# Patient Record
Sex: Female | Born: 1955 | Race: White | Hispanic: No | Smoking: Never smoker
Health system: Southern US, Community
[De-identification: ages and names within clinical notes are randomized; demographics above are authoritative.]

## PROBLEM LIST (undated history)

## (undated) DIAGNOSIS — G709 Myoneural disorder, unspecified: Secondary | ICD-10-CM

## (undated) DIAGNOSIS — M1990 Unspecified osteoarthritis, unspecified site: Secondary | ICD-10-CM

## (undated) DIAGNOSIS — M810 Age-related osteoporosis without current pathological fracture: Secondary | ICD-10-CM

## (undated) DIAGNOSIS — E7800 Pure hypercholesterolemia, unspecified: Secondary | ICD-10-CM

## (undated) DIAGNOSIS — R519 Headache, unspecified: Secondary | ICD-10-CM

## (undated) DIAGNOSIS — C4491 Basal cell carcinoma of skin, unspecified: Secondary | ICD-10-CM

## (undated) DIAGNOSIS — M542 Cervicalgia: Secondary | ICD-10-CM

## (undated) DIAGNOSIS — K219 Gastro-esophageal reflux disease without esophagitis: Secondary | ICD-10-CM

## (undated) DIAGNOSIS — M549 Dorsalgia, unspecified: Secondary | ICD-10-CM

## (undated) DIAGNOSIS — S060X9A Concussion with loss of consciousness of unspecified duration, initial encounter: Secondary | ICD-10-CM

## (undated) DIAGNOSIS — R51 Headache: Secondary | ICD-10-CM

## (undated) HISTORY — PX: HERNIA REPAIR: SHX51

## (undated) HISTORY — PX: WRIST SURGERY: SHX841

## (undated) HISTORY — DX: Myoneural disorder, unspecified: G70.9

## (undated) HISTORY — DX: Gastro-esophageal reflux disease without esophagitis: K21.9

## (undated) HISTORY — PX: ABDOMINAL HYSTERECTOMY: SHX81

## (undated) HISTORY — DX: Headache, unspecified: R51.9

## (undated) HISTORY — DX: Unspecified osteoarthritis, unspecified site: M19.90

## (undated) HISTORY — DX: Age-related osteoporosis without current pathological fracture: M81.0

## (undated) HISTORY — PX: KNEE SURGERY: SHX244

## (undated) HISTORY — DX: Dorsalgia, unspecified: M54.9

## (undated) HISTORY — DX: Pure hypercholesterolemia, unspecified: E78.00

## (undated) HISTORY — DX: Concussion with loss of consciousness of unspecified duration, initial encounter: S06.0X9A

## (undated) HISTORY — PX: EYE SURGERY: SHX253

## (undated) HISTORY — DX: Headache: R51

## (undated) HISTORY — DX: Cervicalgia: M54.2

## (undated) HISTORY — DX: Basal cell carcinoma of skin, unspecified: C44.91

---

## 1999-12-12 ENCOUNTER — Other Ambulatory Visit: Admission: RE | Admit: 1999-12-12 | Discharge: 1999-12-12 | Payer: Self-pay | Admitting: Obstetrics & Gynecology

## 2000-12-14 DIAGNOSIS — S060XAA Concussion with loss of consciousness status unknown, initial encounter: Secondary | ICD-10-CM

## 2000-12-14 DIAGNOSIS — S060X9A Concussion with loss of consciousness of unspecified duration, initial encounter: Secondary | ICD-10-CM | POA: Insufficient documentation

## 2000-12-14 HISTORY — DX: Concussion with loss of consciousness of unspecified duration, initial encounter: S06.0X9A

## 2000-12-14 HISTORY — DX: Concussion with loss of consciousness status unknown, initial encounter: S06.0XAA

## 2000-12-16 ENCOUNTER — Emergency Department (HOSPITAL_COMMUNITY): Admission: EM | Admit: 2000-12-16 | Discharge: 2000-12-16 | Payer: Self-pay | Admitting: Emergency Medicine

## 2001-01-10 ENCOUNTER — Encounter: Payer: Self-pay | Admitting: Orthopedic Surgery

## 2001-01-10 ENCOUNTER — Ambulatory Visit (HOSPITAL_COMMUNITY): Admission: RE | Admit: 2001-01-10 | Discharge: 2001-01-10 | Payer: Self-pay | Admitting: Orthopedic Surgery

## 2001-01-25 ENCOUNTER — Ambulatory Visit (HOSPITAL_COMMUNITY): Admission: RE | Admit: 2001-01-25 | Discharge: 2001-01-25 | Payer: Self-pay | Admitting: Orthopedic Surgery

## 2001-01-25 ENCOUNTER — Encounter: Payer: Self-pay | Admitting: Orthopedic Surgery

## 2001-02-18 ENCOUNTER — Inpatient Hospital Stay (HOSPITAL_COMMUNITY): Admission: EM | Admit: 2001-02-18 | Discharge: 2001-02-20 | Payer: Self-pay | Admitting: Emergency Medicine

## 2001-02-18 ENCOUNTER — Encounter: Payer: Self-pay | Admitting: Emergency Medicine

## 2001-02-18 ENCOUNTER — Encounter: Payer: Self-pay | Admitting: Internal Medicine

## 2001-02-19 ENCOUNTER — Encounter: Payer: Self-pay | Admitting: Neurology

## 2001-02-28 ENCOUNTER — Encounter: Admission: RE | Admit: 2001-02-28 | Discharge: 2001-05-29 | Payer: Self-pay | Admitting: Internal Medicine

## 2001-05-08 ENCOUNTER — Encounter: Admission: RE | Admit: 2001-05-08 | Discharge: 2001-08-06 | Payer: Self-pay | Admitting: Orthopedic Surgery

## 2001-06-03 ENCOUNTER — Other Ambulatory Visit: Admission: RE | Admit: 2001-06-03 | Discharge: 2001-06-03 | Payer: Self-pay | Admitting: Obstetrics & Gynecology

## 2001-07-09 ENCOUNTER — Encounter: Payer: Self-pay | Admitting: Neurology

## 2001-07-09 ENCOUNTER — Ambulatory Visit (HOSPITAL_COMMUNITY): Admission: RE | Admit: 2001-07-09 | Discharge: 2001-07-09 | Payer: Self-pay | Admitting: Neurology

## 2001-09-02 ENCOUNTER — Encounter: Admission: RE | Admit: 2001-09-02 | Discharge: 2001-12-01 | Payer: Self-pay | Admitting: Internal Medicine

## 2002-03-26 ENCOUNTER — Encounter: Admission: RE | Admit: 2002-03-26 | Discharge: 2002-03-26 | Payer: Self-pay | Admitting: Internal Medicine

## 2002-03-26 ENCOUNTER — Encounter: Payer: Self-pay | Admitting: Internal Medicine

## 2002-10-16 ENCOUNTER — Emergency Department (HOSPITAL_COMMUNITY): Admission: EM | Admit: 2002-10-16 | Discharge: 2002-10-16 | Payer: Self-pay | Admitting: *Deleted

## 2002-10-16 ENCOUNTER — Encounter: Payer: Self-pay | Admitting: Internal Medicine

## 2003-02-11 ENCOUNTER — Other Ambulatory Visit: Admission: RE | Admit: 2003-02-11 | Discharge: 2003-02-11 | Payer: Self-pay | Admitting: Obstetrics & Gynecology

## 2005-04-25 ENCOUNTER — Other Ambulatory Visit: Admission: RE | Admit: 2005-04-25 | Discharge: 2005-04-25 | Payer: Self-pay | Admitting: Obstetrics & Gynecology

## 2006-09-27 ENCOUNTER — Encounter: Admission: RE | Admit: 2006-09-27 | Discharge: 2006-09-27 | Payer: Self-pay | Admitting: Obstetrics & Gynecology

## 2006-12-19 ENCOUNTER — Ambulatory Visit: Payer: Self-pay | Admitting: Internal Medicine

## 2007-01-14 ENCOUNTER — Ambulatory Visit: Payer: Self-pay | Admitting: Internal Medicine

## 2007-01-21 ENCOUNTER — Encounter (INDEPENDENT_AMBULATORY_CARE_PROVIDER_SITE_OTHER): Payer: Self-pay | Admitting: Specialist

## 2007-01-21 ENCOUNTER — Ambulatory Visit: Payer: Self-pay | Admitting: Internal Medicine

## 2008-01-16 ENCOUNTER — Encounter: Admission: RE | Admit: 2008-01-16 | Discharge: 2008-01-16 | Payer: Self-pay | Admitting: Obstetrics & Gynecology

## 2009-02-11 ENCOUNTER — Encounter: Admission: RE | Admit: 2009-02-11 | Discharge: 2009-02-11 | Payer: Self-pay | Admitting: Internal Medicine

## 2010-04-28 ENCOUNTER — Emergency Department (HOSPITAL_COMMUNITY): Admission: EM | Admit: 2010-04-28 | Discharge: 2010-04-28 | Payer: Self-pay | Admitting: Family Medicine

## 2010-12-04 ENCOUNTER — Encounter: Payer: Self-pay | Admitting: Obstetrics & Gynecology

## 2011-02-06 ENCOUNTER — Other Ambulatory Visit: Payer: Self-pay | Admitting: Obstetrics & Gynecology

## 2011-02-06 DIAGNOSIS — R928 Other abnormal and inconclusive findings on diagnostic imaging of breast: Secondary | ICD-10-CM

## 2011-02-14 ENCOUNTER — Ambulatory Visit
Admission: RE | Admit: 2011-02-14 | Discharge: 2011-02-14 | Disposition: A | Discharge status: Home / Self Care | Payer: Medicare Other | Source: Ambulatory Visit | Attending: Obstetrics & Gynecology | Admitting: Obstetrics & Gynecology

## 2011-02-14 DIAGNOSIS — R928 Other abnormal and inconclusive findings on diagnostic imaging of breast: Secondary | ICD-10-CM

## 2011-03-31 NOTE — H&P (Signed)
[place]. [facility]  Patient:    Rebecca Montgomery, Rebecca Montgomery                      MRN: [id number] Adm. Date:  [id number] Attending:  Enrique Sack CC:         Erskine Speed, M.D.   History and Physical  CHIEF COMPLAINT: This patient is a 55 year old lady who has had problems with gait and unsteadiness of gait associated with clumsiness, truncal ataxia, and poor concentration since the last approximately two months.  HISTORY OF PRESENT ILLNESS: She had an epidural injection secondary to a herniated disk approximately two months ago and her symptoms seem to have been triggered since this time.  She also describes vague paresthesias, a feeling of floating of her body.  She denies any excessive stressors, although her job is a stressful one.  PAST MEDICAL/SURGICAL HISTORY: Hysterectomy.  No other serious illnesses or operations.  CURRENT MEDICATIONS: None regular.  ALLERGIES: None documented.  SOCIAL HISTORY: She is a single mother and her son lives with her.  She does not smoke and does not drink.  She is a Animator.  REVIEW OF SYSTEMS: Apart from the symptoms mentioned above there are no other symptoms referable to the cardiovascular, respiratory, gastrointestinal, musculoskeletal, endocrine, dermatologic, psychiatric, or genitourinary systems.  PHYSICAL EXAMINATION:  VITAL SIGNS: She is afebrile.  GENERAL: She is hemodynamically stable.  CARDIOVASCULAR: Heart sounds are present and normal, with no murmurs or other sounds.  CHEST: Lung fields are clear to auscultation and percussion.  ABDOMEN: Soft, nontender, no hepatosplenomegaly.  NEUROLOGIC: She is alert and oriented.  She has slight impairment of memory and mentation but there are no obvious focal neurological signs, especially cerebellar signs.  She is able to move her body but slowly.  She has normal tone and power in her upper and lower limbs.  Cranial nerves all seem to  be intact.  LABORATORY DATA: CT scan of the brain was normal in the emergency room.  IMPRESSION/PLAN: Unsteadiness of gait/truncal ataxia.  We will need to look at the cerebellum more closely and rule out demyelinating process.  I will get an MRI of the brain with and without contrast.  The other possibility here is of a psychosomatic symptomatology.  There does not seem to be any obvious triggering stressors.  Further recommendations will depend on the patients progress and she may need a neurological evaluation. DD:  02/18/01 TD:  02/18/01 Job: [id number]

## 2011-03-31 NOTE — Consult Note (Signed)
[place]. [facility]  Patient:    Rebecca Montgomery, Rebecca Montgomery                      MRN: [id number] Adm. Date:  [id number] Attending:  Enrique Sack                          Consultation Report  DATE OF BIRTH:  06/01/1956  REASON FOR CONSULTATION:  This 55 year old, right-handed, white, divorced female is seen at the request of Dr. Nila Nephew to evaluate a history of vertigo and weakness.  HISTORY OF PRESENT ILLNESS:  Rebecca Montgomery has been in good health most of her life.  She does have a history of migraine headaches; TM joint disease, requiring a splint; and depression following the death of her third child. She currently is a single mother with two children and has two jobs.  One of her children has birth defects.  She had been in her usual state of health until December 15, 2000, when she was working outside of her house and sorting wood and had a piece of wood in one hand and arm.  She fell with this log in her hand backwards, striking her head.  There was no definite loss of consciousness.  She was able to get up and go to her home and was taken to the [facility] Emergency Room.  She did not have any x-rays. Subsequently, she had problems with lower back pain, radiating down the front or anterior aspect of her legs and down the posterior aspect of her legs.  She has been seen by Dr. Lunette Stands for this symptomatology.  It was noted at the time that she had some neck pain as well according to Dr. Marcy Salvo notes from December 20, 2000.  Also, there was some history of her having had a back injury several years ago treated by Dr. Charlett Blake.  She had had a previous MRI study in 1996, demonstrating a bulging disk but no herniation.  Her examination at the time revealed a couple of beats of clonus at both ankles. There was some restriction of motion in her neck.  She underwent a MRI study of the lumbar spine without contrast on December 25, 2000, which was  compared with lumbar radiographs done December 20, 2000, in Dr. Marcy Salvo office.  There was evidence of scoliosis convexed to the right at L1 and to the left at L5. There were some suspicious marrow signal abnormalities; the L4-L5 disk is desiccated.  The other disks were well hydrated.  The conus medullaris extended to the upper L1 level and appeared to be normal.  The L4-L5 level showed a mild annular disk bulging present with small, left-sided disk protrusion best seen on parasagittal images but without resulting foraminal stenosis on the L4 nerve root.  There was no significant central stenosis. There was some mild facet hypertrophy bilaterally.  There was mild facet hypertrophy at the L5-S1 level, but the disk remained well hydrated.  The impression was mild biconvexed scoliosis with degenerative disk disease at L4-5 and shallow posterior lateral disk protrusion to the left, not causing any spinal stenosis or nerve root encroachment.  There was mild facet hypertrophy at L4-5 and L5-S1.  She was tried on a course of p.o. prednisone. She was also tried on epidural steroid treatment with some improvement in symptomatology.  She was also given a course of Vioxx and recommended for  light duty, not lifting over 10 pounds.  In addition to lower back pain, which has improved, she has been having difficulty with dizziness at times associated with a spinning sensation, particularly after having one procaine injection after her epidural steroids.  At times, however, she describes a light-headed sensation.  This dizziness occurs in the sitting, lying, and standing positions and can be associate with nausea.  It is aggravated by movement of her head.  It has rarely been associated with vomiting but has been associated with some vomiting.  It never goes away.  She describes a floating sensation with this.  She has also described difficulty with concentration and possibly some difficulty with her  remembrance and also slowness in movement.  She states that she had taken jelly and putting it in her coffee.  She has been dropping things with her hands and arms.  She had done fairly well following her epidural steroid injections.  She was at church on Sunday and subsequently had to drive to a baby shower and was to drive back to church.  She drove 1 hour 45 minutes to the baby shower, did not feel well when she went to room, noticing some fluorescent lights.  She then felt dizzy and had some nausea.  She had to sit down.  She then felt okay to drive back but had difficulty remembering when she was driving back and concentrating when driving back and noticed that it took a lot of effort to perform these tasks.  When she got to her church, she was able to go in but had to be carried out and was brought to the emergency room and admitted by Dr. Amanda Cockayne associate.  LABORATORY DATA:  She has had studies in the hospital, which I reviewed, including an MRI study of the brain and intracranial MRA, both of which were normal.  She has had a CT scan of the brain, which was unremarkable.  Other laboratory studies included a glucose of 113, BUN 5, sodium 139, potassium 3.4, chloride 109, CO2 content 25.  Liver function tests were normal.  White blood cell count was 5300, hemoglobin 12.1, hematocrit 34.4, 59% polys, 28% lymphs, and 12% monocytes, which was high.  Erythrocyte sedimentation rate was 30 with a vitamin B12 level of 249 and RBC of 334.  Her TSH was in the high normal range at 4.952.  MRI study of the brain and MRA that I have mentioned as well as CT scan.  I did not see a report of any cervical spine x-rays.  PHYSICAL EXAMINATION:  GENERAL:  Examination revealed a well-developed, depressed-appearing white female with slowness of movement and generalized bradykinesia.  VITAL SIGNS:  Her sitting blood pressure in the right arm was 120/80 and left arm 110/80.  HEENT:  Both tympanic  membranes were seen.  Neck flexion-extension maneuvers revealed aggravation of dizziness with turning her head.  When first seen, she was in the sitting position and subsequently needed to lie down because she  felt badly.  When she opened her jaw, she opened it to the right.  MENTAL STATUS:  She was alert and oriented x 3.  She knew the president but had difficulty with vice president.  She scored 29 out of 30, not remembering one of the three objects at 5 minutes.  She had no evidence of an aphasia and had good pronunciation of words.  There was no evidence of an agnosia or apraxia.  She could give historical features, such as the  history as above, though at times, it was somewhat disjointed.  She could give the ages and names of her children and their birth dates, and she could perform calculations such as nickels and a dollar, $1.25, $1.35, spell "world" forwards and backwards, could subtract $5.95 from a $20 bill.  NEUROLOGIC:  Her cranial nerve examination revealed her visual fields to be full, pupils to be reactive from 4 to 3 bilaterally.  There was a right ptosis.  Both disks were seen and flat.  The extraocular movements were full. There was no nystagmus.  There was no ocular dysmetria, and corneals were present.  The facial sensation was equal.  Hearing was intact.  Tongue was midline.  The uvula was midline.  Gags were present.  Jaw opened to the right. Sternocleidomastoid and trapezius testing was normal.  Motor examination revealed excellent strength in the upper and lower extremities.  She had an intention tremor with the right hand and arm, which was distractible.  There was no cogwheeling present.  Her sensory examination was intact to pinprick, touch, and joint position.  The deep tendon reflexes were 2 to 3+.  Plantar responses were downgoing.  Her gait examination revealed a slow gait with anterior flexion of her lumbar spine.  As she walked, it was wide based  with probably a 5-inch base and short steps.  She could stand on her toes.  She could stand on her heels.  IMPRESSION: 1. Vertigo, code 780.4. 2. Gait disorder, code 781.2. 3. Lower back pain, code 724.4. 4. Head trauma, code 854.01. 5. Memory loss, code 294.9. 6. Depression, code 311. 7. Postconcussion syndrome, code 310.2.  PLAN:  To obtain cervical spine x-rays and begin her on a course of amitriptyline. DD:  02/19/01 TD:  02/19/01 Job: [id number]
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
## 2013-02-17 ENCOUNTER — Telehealth: Payer: Self-pay | Admitting: Neurology

## 2013-02-17 NOTE — Telephone Encounter (Signed)
Pt called and was asking for last ofv note copy to be faxed to her [phone].  Release faxed to [place] and documents sent.

## 2013-09-16 DIAGNOSIS — J383 Other diseases of vocal cords: Secondary | ICD-10-CM

## 2013-09-16 HISTORY — DX: Other diseases of vocal cords: J38.3

## 2014-12-11 DIAGNOSIS — B009 Herpesviral infection, unspecified: Secondary | ICD-10-CM

## 2014-12-11 HISTORY — DX: Herpesviral infection, unspecified: B00.9

## 2015-01-20 DIAGNOSIS — Z87892 Personal history of anaphylaxis: Secondary | ICD-10-CM

## 2015-01-20 HISTORY — DX: Personal history of anaphylaxis: Z87.892

## 2016-07-03 DIAGNOSIS — C44319 Basal cell carcinoma of skin of other parts of face: Secondary | ICD-10-CM | POA: Insufficient documentation

## 2016-08-02 DIAGNOSIS — H9190 Unspecified hearing loss, unspecified ear: Secondary | ICD-10-CM | POA: Insufficient documentation

## 2016-08-09 DIAGNOSIS — Z90710 Acquired absence of both cervix and uterus: Secondary | ICD-10-CM | POA: Insufficient documentation

## 2016-08-09 HISTORY — DX: Acquired absence of both cervix and uterus: Z90.710

## 2018-04-03 DIAGNOSIS — K219 Gastro-esophageal reflux disease without esophagitis: Secondary | ICD-10-CM

## 2018-04-03 HISTORY — DX: Gastro-esophageal reflux disease without esophagitis: K21.9

## 2018-07-04 DIAGNOSIS — E7849 Other hyperlipidemia: Secondary | ICD-10-CM | POA: Insufficient documentation

## 2018-07-04 DIAGNOSIS — E785 Hyperlipidemia, unspecified: Secondary | ICD-10-CM

## 2018-07-04 HISTORY — DX: Hyperlipidemia, unspecified: E78.5

## 2018-07-04 HISTORY — DX: Other hyperlipidemia: E78.49

## 2018-07-06 DIAGNOSIS — Z1211 Encounter for screening for malignant neoplasm of colon: Secondary | ICD-10-CM | POA: Insufficient documentation

## 2018-07-06 HISTORY — DX: Encounter for screening for malignant neoplasm of colon: Z12.11

## 2018-07-16 ENCOUNTER — Emergency Department (HOSPITAL_COMMUNITY)
Admission: EM | Admit: 2018-07-16 | Discharge: 2018-07-17 | Disposition: A | Discharge status: Home / Self Care | Payer: Medicare Other | Attending: Emergency Medicine | Admitting: Emergency Medicine

## 2018-07-16 ENCOUNTER — Encounter (HOSPITAL_COMMUNITY): Payer: Self-pay | Admitting: Emergency Medicine

## 2018-07-16 ENCOUNTER — Other Ambulatory Visit: Payer: Self-pay

## 2018-07-16 ENCOUNTER — Emergency Department (HOSPITAL_COMMUNITY): Payer: Medicare Other

## 2018-07-16 DIAGNOSIS — M542 Cervicalgia: Secondary | ICD-10-CM | POA: Diagnosis not present

## 2018-07-16 DIAGNOSIS — R51 Headache: Secondary | ICD-10-CM | POA: Diagnosis not present

## 2018-07-16 DIAGNOSIS — R42 Dizziness and giddiness: Secondary | ICD-10-CM | POA: Insufficient documentation
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
## 2018-07-16 NOTE — ED Notes (Signed)
Pt ambulated from waiting room to Pod C without Assistance
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
## 2018-07-17 ENCOUNTER — Emergency Department (HOSPITAL_COMMUNITY): Payer: Medicare Other

## 2018-07-17 LAB — I-STAT CHEM 8, ED
BUN: 9 mg/dL (ref 8–23)
CALCIUM ION: 1.19 mmol/L (ref 1.15–1.40)
Chloride: 104 mmol/L (ref 98–111)
Creatinine, Ser: 0.8 mg/dL (ref 0.44–1.00)
GLUCOSE: 98 mg/dL (ref 70–99)
HCT: 37 % (ref 36.0–46.0)
Hemoglobin: 12.6 g/dL (ref 12.0–15.0)
Potassium: 3.9 mmol/L (ref 3.5–5.1)
Sodium: 139 mmol/L (ref 135–145)
TCO2: 26 mmol/L (ref 22–32)

## 2018-07-17 MED ORDER — DIAZEPAM 5 MG PO TABS
5.0000 mg | ORAL_TABLET | Freq: Once | ORAL | Status: DC
  Filled 2018-07-17: qty 1

## 2018-07-17 MED ORDER — CYCLOBENZAPRINE HCL 5 MG PO TABS: 5.0000 mg | ORAL_TABLET | Freq: Two times a day (BID) | ORAL | 0 refills | Status: DC | PRN

## 2018-07-17 MED ORDER — IOPAMIDOL (ISOVUE-370) INJECTION 76%
INTRAVENOUS | Status: AC
  Filled 2018-07-17: qty 50

## 2018-07-17 MED ORDER — IOPAMIDOL (ISOVUE-370) INJECTION 76%
50.0000 mL | Freq: Once | INTRAVENOUS | Status: AC | PRN
  Administered 2018-07-17: 50 mL via INTRAVENOUS

## 2018-07-17 MED ORDER — NAPROXEN 500 MG PO TABS: 500.0000 mg | ORAL_TABLET | Freq: Two times a day (BID) | ORAL | 0 refills | Status: DC
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
## 2018-07-17 NOTE — ED Notes (Signed)
Patient transported to CT 

## 2018-07-24 ENCOUNTER — Other Ambulatory Visit: Payer: Self-pay | Admitting: Orthopedic Surgery

## 2018-07-24 DIAGNOSIS — R2689 Other abnormalities of gait and mobility: Secondary | ICD-10-CM

## 2018-07-24 DIAGNOSIS — R200 Anesthesia of skin: Secondary | ICD-10-CM

## 2018-07-24 DIAGNOSIS — R531 Weakness: Secondary | ICD-10-CM

## 2018-07-28 ENCOUNTER — Ambulatory Visit
Admission: RE | Admit: 2018-07-28 | Discharge: 2018-07-28 | Disposition: A | Discharge status: Home / Self Care | Payer: BC Managed Care – PPO | Source: Ambulatory Visit | Attending: Orthopedic Surgery | Admitting: Orthopedic Surgery

## 2018-07-28 DIAGNOSIS — R200 Anesthesia of skin: Secondary | ICD-10-CM

## 2018-07-28 DIAGNOSIS — R531 Weakness: Secondary | ICD-10-CM

## 2018-07-28 DIAGNOSIS — R2689 Other abnormalities of gait and mobility: Secondary | ICD-10-CM

## 2018-08-02 ENCOUNTER — Encounter: Payer: Self-pay | Admitting: *Deleted

## 2018-08-05 ENCOUNTER — Encounter: Payer: Self-pay | Admitting: Neurology

## 2018-08-05 ENCOUNTER — Ambulatory Visit: Payer: Medicare Other | Admitting: Neurology

## 2018-08-05 VITALS — BP 137/85 | HR 76 | Ht 68.0 in | Wt 150.0 lb

## 2018-08-05 DIAGNOSIS — R413 Other amnesia: Secondary | ICD-10-CM | POA: Insufficient documentation

## 2018-08-05 DIAGNOSIS — R519 Headache, unspecified: Secondary | ICD-10-CM | POA: Insufficient documentation

## 2018-08-05 DIAGNOSIS — R51 Headache: Secondary | ICD-10-CM

## 2018-08-05 HISTORY — DX: Headache, unspecified: R51.9
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
## 2018-08-06 ENCOUNTER — Telehealth: Payer: Self-pay | Admitting: *Deleted

## 2018-08-06 ENCOUNTER — Ambulatory Visit: Payer: Medicare Other | Admitting: Neurology

## 2018-08-06 ENCOUNTER — Telehealth: Payer: Self-pay | Admitting: Neurology

## 2018-08-06 DIAGNOSIS — R519 Headache, unspecified: Secondary | ICD-10-CM

## 2018-08-06 DIAGNOSIS — R410 Disorientation, unspecified: Secondary | ICD-10-CM | POA: Diagnosis not present

## 2018-08-06 DIAGNOSIS — R51 Headache: Principal | ICD-10-CM

## 2018-08-06 DIAGNOSIS — R413 Other amnesia: Secondary | ICD-10-CM

## 2018-08-06 LAB — FOLATE

## 2018-08-06 LAB — SEDIMENTATION RATE: SED RATE: 11 mm/h (ref 0–40)

## 2018-08-06 LAB — VITAMIN B12: Vitamin B-12: 393 pg/mL (ref 232–1245)

## 2018-08-06 LAB — RPR: RPR Ser Ql: NONREACTIVE

## 2018-08-06 LAB — C-REACTIVE PROTEIN: CRP: 2 mg/L (ref 0–10)

## 2018-08-06 LAB — TSH: TSH: 2.46 u[IU]/mL (ref 0.450–4.500)

## 2018-08-06 NOTE — Telephone Encounter (Signed)
-----   Message from Marcial Pacas, MD sent at 08/06/2018 12:05 PM EDT ----- Please call patient for normal laboratory result

## 2018-08-06 NOTE — Telephone Encounter (Signed)
Spoke to patient - she is aware of her lab results. 

## 2018-08-06 NOTE — Telephone Encounter (Signed)
Patient seen yesterday. Forgot to ask Dr. Krista Blue if any type of therapy would help. Here today for EEG. Best call back is [phone].

## 2018-08-06 NOTE — Telephone Encounter (Signed)
Per vo by Dr. Krista Blue, she offered to refer her to PT for gait training.  I spoke to the patient and she has had this therapy in the past.  States she remembers the home exercises and would like to try it on her own first.  She is aware that we are happy to place the referral, if she changes her mind.

## 2018-08-09 NOTE — Procedures (Signed)
   HISTORY: 62 years old female, with history of headaches, also complains of memory loss  TECHNIQUE:  16 channel EEG was performed based on standard 10-16 international system. One channel was dedicated to EKG, which has demonstrates normal sinus rhythm of beats per minutes.  Upon awakening, the posterior background activity was well-developed, in alpha range, 10 Hz, reactive to eye opening and closure.  There was no evidence of epileptiform discharge.  Photic stimulation was performed, which induced a symmetric photic driving.  Hyperventilation was performed, there was no abnormality elicit.  No sleep was achieved.  CONCLUSION: This is a  normal awake EEG.  There is no electrodiagnostic evidence of epileptiform discharge.  Marcial Pacas, M.D. Ph.D.  [facility] [place], [place] [address] Phone: [phone] Fax:      [phone]

## 2018-08-27 ENCOUNTER — Ambulatory Visit: Payer: Medicare Other | Admitting: Neurology

## 2018-08-27 ENCOUNTER — Encounter: Payer: Self-pay | Admitting: Neurology

## 2018-08-27 VITALS — BP 142/83 | HR 79 | Ht 68.0 in | Wt 152.0 lb

## 2018-08-27 DIAGNOSIS — R413 Other amnesia: Secondary | ICD-10-CM | POA: Diagnosis not present

## 2018-08-27 DIAGNOSIS — R51 Headache: Secondary | ICD-10-CM | POA: Diagnosis not present

## 2018-08-27 DIAGNOSIS — R519 Headache, unspecified: Secondary | ICD-10-CM
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
## 2018-10-03 DIAGNOSIS — Z85828 Personal history of other malignant neoplasm of skin: Secondary | ICD-10-CM

## 2018-10-03 HISTORY — DX: Personal history of other malignant neoplasm of skin: Z85.828
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
## 2018-11-20 ENCOUNTER — Ambulatory Visit: Payer: BC Managed Care – PPO | Admitting: Neurology

## 2018-11-26 ENCOUNTER — Ambulatory Visit: Payer: BC Managed Care – PPO | Admitting: Cardiology

## 2018-12-27 ENCOUNTER — Encounter: Payer: Self-pay | Admitting: Cardiology

## 2018-12-27 ENCOUNTER — Ambulatory Visit: Payer: Medicare Other | Admitting: Cardiology

## 2018-12-27 DIAGNOSIS — R002 Palpitations: Secondary | ICD-10-CM

## 2018-12-27 DIAGNOSIS — R0789 Other chest pain: Secondary | ICD-10-CM

## 2018-12-27 DIAGNOSIS — E785 Hyperlipidemia, unspecified: Secondary | ICD-10-CM

## 2018-12-27 HISTORY — DX: Other chest pain: R07.89

## 2018-12-27 HISTORY — DX: Hyperlipidemia, unspecified: E78.5

## 2018-12-27 HISTORY — DX: Palpitations: R00.2

## 2018-12-27 NOTE — Patient Instructions (Signed)
Medication Instructions:  Your physician recommends that you continue on your current medications as directed. Please refer to the Current Medication list given to you today.  If you need a refill on your cardiac medications before your next appointment, please call your pharmacy.   Lab work: None. If you have labs (blood work) drawn today and your tests are completely normal, you will receive your results only by: Marland Kitchen MyChart Message (if you have MyChart) OR . A paper copy in the mail If you have any lab test that is abnormal or we need to change your treatment, we will call you to review the results.  Testing/Procedures: Your physician has requested that you have an echocardiogram. Echocardiography is a painless test that uses sound waves to create images of your heart. It provides your doctor with information about the size and shape of your heart and how well your heart's chambers and valves are working. This procedure takes approximately one hour. There are no restrictions for this procedure.  Your physician has requested that you have a stress echocardiogram. For further information please visit [email or web address]. Please follow instruction sheet as given.  Your physician has recommended that you wear an event monitor. Event monitors are medical devices that record the heart's electrical activity. Doctors most often [place] these monitors to diagnose arrhythmias. Arrhythmias are problems with the speed or rhythm of the heartbeat. The monitor is a small, portable device. You can wear one while you do your normal daily activities. This is usually used to diagnose what is causing palpitations/syncope (passing out). Wear for 30 days     Follow-Up: At [facility], you and your health needs are our priority.  As part of our continuing mission to provide you with exceptional heart care, we have created designated Provider Care Teams.  These Care Teams include your primary Cardiologist (physician)  and Advanced Practice Providers (APPs -  Physician Assistants and Nurse Practitioners) who all work together to provide you with the care you need, when you need it. You will need a follow up appointment in 6 weeks.  Please call our office 2 months in advance to schedule this appointment.  You may see No primary care provider on file. or another member of our [employer] Provider Team in [place]: Shirlee More, MD . Jyl Heinz, MD  Any Other Special Instructions Will Be Listed Below (If Applicable).   Echocardiogram An echocardiogram is a procedure that uses painless sound waves (ultrasound) to produce an image of the heart. Images from an echocardiogram can provide important information about:  Signs of coronary artery disease (CAD).  Aneurysm detection. An aneurysm is a weak or damaged part of an artery wall that bulges out from the normal force of blood pumping through the body.  Heart size and shape. Changes in the size or shape of the heart can be associated with certain conditions, including heart failure, aneurysm, and CAD.  Heart muscle function.  Heart valve function.  Signs of a past heart attack.  Fluid buildup around the heart.  Thickening of the heart muscle.  A tumor or infectious growth around the heart valves. Tell a health care provider about:  Any allergies you have.  All medicines you are taking, including vitamins, herbs, eye drops, creams, and over-the-counter medicines.  Any blood disorders you have.  Any surgeries you have had.  Any medical conditions you have.  Whether you are pregnant or may be pregnant. What are the risks? Generally, this is a safe procedure. However,  problems may occur, including:  Allergic reaction to dye (contrast) that may be used during the procedure. What happens before the procedure? No specific preparation is needed. You may eat and drink normally. What happens during the procedure?   An IV tube may be  inserted into one of your veins.  You may receive contrast through this tube. A contrast is an injection that improves the quality of the pictures from your heart.  A gel will be applied to your chest.  A wand-like tool (transducer) will be moved over your chest. The gel will help to transmit the sound waves from the transducer.  The sound waves will harmlessly bounce off of your heart to allow the heart images to be captured in real-time motion. The images will be recorded on a computer. The procedure may vary among health care providers and hospitals. What happens after the procedure?  You may return to your normal, everyday life, including diet, activities, and medicines, unless your health care provider tells you not to do that. Summary  An echocardiogram is a procedure that uses painless sound waves (ultrasound) to produce an image of the heart.  Images from an echocardiogram can provide important information about the size and shape of your heart, heart muscle function, heart valve function, and fluid buildup around your heart.  You do not need to do anything to prepare before this procedure. You may eat and drink normally.  After the echocardiogram is completed, you may return to your normal, everyday life, unless your health care provider tells you not to do that. This information is not intended to replace advice given to you by your health care provider. Make sure you discuss any questions you have with your health care provider. Document Released: 10/27/2000 Document Revised: 12/02/2016 Document Reviewed: 12/02/2016 Elsevier Interactive Patient Education  2019 [employer].   Exercise Stress Echocardiogram  An exercise stress echocardiogram is a test to check how well your heart is working. This test uses sound waves (ultrasound) and a computer to make images of your heart before and after exercise. Ultrasound images that are taken before you exercise (your resting  echocardiogram) will show how much blood is getting to your heart muscle and how well your heart muscle and heart valves are functioning. During the next part of this test, you will walk on a treadmill or ride a stationary bike to see how exercise affects your heart. While you exercise, the electrical activity of your heart will be monitored with an electrocardiogram (ECG). Your blood pressure will also be monitored. You may have this test if you:  Have chest pain or other symptoms of a heart problem.  Recently had a heart attack or heart surgery.  Have heart valve problems.  Have a condition that causes narrowing of the blood vessels that supply your heart (coronary artery disease).  Have a high risk of heart disease and are starting a new exercise program.  Have a high risk of heart disease and need to have major surgery. Tell a health care provider about:  Any allergies you have.  All medicines you are taking, including vitamins, herbs, eye drops, creams, and over-the-counter medicines.  Any problems you or family members have had with anesthetic medicines.  Any blood disorders you have.  Any surgeries you have had.  Any medical conditions you have.  Whether you are pregnant or may be pregnant. What are the risks? Generally, this is a safe procedure. However, problems may occur, including:  Chest pain.  Dizziness or light-headedness.  Shortness of breath.  Increased or irregular heartbeat (palpitations).  Nausea or vomiting.  Heart attack (very rare). What happens before the procedure?  Follow instructions from your health care provider about eating or drinking restrictions. You may be asked to avoid all forms of caffeine for 24 hours before your procedure, or as told by your health care provider.  Ask your health care provider about changing or stopping your regular medicines. This is especially important if you are taking diabetes medicines or blood  thinners.  If you use an inhaler, bring it with you to the test.  Wear loose, comfortable clothing and walking shoes.  Do notuse any products that contain nicotine or tobacco, such as cigarettes and e-cigarettes, for 4 hours before the test or as told by your health care provider. If you need help quitting, ask your health care provider. What happens during the procedure?  You will take off your clothes from the waist up and put on a hospital gown.  A technician will place electrodes on your chest.  A blood pressure cuff will be placed on your arm.  You will lie down on a table for an ultrasound exam before you exercise. Gel will be rubbed on your chest, and a handheld device (transducer) will be pressed against your chest and moved over your heart.  Then, you will start exercising by walking on a treadmill or pedaling a stationary bicycle.  Your blood pressure and heart rhythm will be monitored while you exercise.  The exercise will gradually get harder or faster.  You will exercise until: ? Your heart reaches a target level. ? You are too tired to continue. ? You cannot continue because of chest pain, weakness, or dizziness.  You will have another ultrasound exam after you stop exercising. The procedure may vary among health care providers and hospitals. What happens after the procedure?  Your heart rate and blood pressure will be monitored until they return to your normal levels. Summary  An exercise stress echocardiogram is a test that uses ultrasound to check how well your heart works before and after exercise.  Before the test, follow instructions from your health care provider about stopping medications, avoiding nicotine and tobacco, and avoiding certain foods and drinks.  During the test, your blood pressure and heart rhythm will be monitored while you exercise on a treadmill or stationary bicycle. This information is not intended to replace advice given to you by  your health care provider. Make sure you discuss any questions you have with your health care provider. Document Released: 11/03/2004 Document Revised: 06/21/2016 Document Reviewed: 06/21/2016 Elsevier Interactive Patient Education  2019 [employer].

## 2018-12-27 NOTE — Progress Notes (Signed)
Cardiology Consultation:    Date:  12/27/2018   ID:  Rebecca Montgomery, DOB 1956-10-03, MRN [id number]  PCP:  Patient, No Pcp Per  Cardiologist:  Jenne Campus, MD   Referring MD: No ref. provider found   Chief Complaint  Patient presents with  . Palpitations  . Hypertension  . Family history of heart problems  . Chest Pain  Having a lot of issues  History of Present Illness:    Rebecca Montgomery is a 63 y.o. female who is being seen today for the evaluation of pain and palpitations at the request of No ref. provider found.  She recently relocated from [place].  When she came here she started having some problem with allergies she is being followed by allergy specialist.  She comes to me because she had some worrisome symptoms she described to have some palpitations.  She feel her heart skipping also feels sensation very forceful beating of her heart.  Described also sensation when her heart will abruptly start going very fast and then slows down abruptly.  It usually happen when she does have some primary care allergist.  Also described to have situation of chest pain.  She will get chest pain at rest it will be heavy-like sensation lasting all day at the same time she goes to gym on a regular basis she is able to exercise with no difficulties.  She can go on the treadmill 4 mph 4% incline and she can last about 30 minutes on this level of exercise with some fatigue shortness of breath.  She is very concerned about her health and she would like to find out what is the problem.  Denies having swelling of lower extremities no dizziness no passing out  Past Medical History:  Diagnosis Date  . Acid reflux   . Back pain   . Basal cell adenocarcinoma   . Concussion 12/2000  . Headache   . Hypercholesteremia   . Neck pain   . Osteoporosis     Past Surgical History:  Procedure Laterality Date  . ABDOMINAL HYSTERECTOMY    . EYE SURGERY Right   . HERNIA REPAIR    . KNEE SURGERY Right     . WRIST SURGERY Right     Current Medications: Current Meds  Medication Sig  . acyclovir (ZOVIRAX) 400 MG tablet Take 400 mg by mouth 2 (two) times daily.   Marland Kitchen aspirin 81 MG tablet Take 81 mg by mouth daily.  Marland Kitchen EPINEPHrine 0.3 mg/0.3 mL IJ SOAJ injection INJECT CONTENTS OF 1 PEN INTO OUTER THIGH FOR SEVERE ALLERGIC REACTION. CALL 911 AFTER USE.  Marland Kitchen omeprazole (PRILOSEC) 20 MG capsule Take 20 mg by mouth daily.      Allergies:   Bee venom; Lidocaine; and Shellfish allergy   Social History   Socioeconomic History  . Marital status: Divorced    Spouse name: Not on file  . Number of children: 2  . Years of education: masters  . Highest education level: Not on file  Occupational History  . Occupation: Retired  Scientific laboratory technician  . Financial resource strain: Not on file  . Food insecurity:    Worry: Not on file    Inability: Not on file  . Transportation needs:    Medical: Not on file    Non-medical: Not on file  Tobacco Use  . Smoking status: Never Smoker  . Smokeless tobacco: Never Used  Substance and Sexual Activity  . Alcohol use: Never  Frequency: Never  . Drug use: Never  . Sexual activity: Not on file  Lifestyle  . Physical activity:    Days per week: Not on file    Minutes per session: Not on file  . Stress: Not on file  Relationships  . Social connections:    Talks on phone: Not on file    Gets together: Not on file    Attends religious service: Not on file    Active member of club or organization: Not on file    Attends meetings of clubs or organizations: Not on file    Relationship status: Not on file  Other Topics Concern  . Not on file  Social History Narrative   Lives at home alone.   Right-handed.   Glass of tea in the morning.      Family History: The patient's family history includes Congestive Heart Failure in her father; Dementia in her mother; Diabetes in her sister and sister; Heart defect in her daughter; Heart failure in her mother;  Hyperlipidemia in her brother, brother, sister, and sister; Stroke in her brother and brother. ROS:   Please see the history of present illness.    All 14 point review of systems negative except as described per history of present illness.  EKGs/Labs/Other Studies Reviewed:    The following studies were reviewed today:   EKG:  EKG is  ordered today.  The ekg ordered today demonstrates normal sinus rhythm possible left atrial enlargement poor R wave progression in anterior precordium raising suspicion for anterior septal wall myocardial infarction nonspecific ST segment changes  Recent Labs: 07/17/2018: BUN 9; Creatinine, Ser 0.80; Hemoglobin 12.6; Potassium 3.9; Sodium 139 08/05/2018: TSH 2.460  Recent Lipid Panel No results found for: CHOL, TRIG, HDL, CHOLHDL, VLDL, LDLCALC, LDLDIRECT  Physical Exam:    VS:  BP 130/82   Pulse 85   Ht 5\' 8"  (1.727 m)   Wt 153 lb 6.4 oz (69.6 kg)   SpO2 98%   BMI 23.32 kg/m     Wt Readings from Last 3 Encounters:  12/27/18 153 lb 6.4 oz (69.6 kg)  08/27/18 152 lb (68.9 kg)  08/05/18 150 lb (68 kg)     GEN:  Well nourished, well developed in no acute distress HEENT: Normal NECK: No JVD; No carotid bruits LYMPHATICS: No lymphadenopathy CARDIAC: RRR, no murmurs, no rubs, no gallops RESPIRATORY:  Clear to auscultation without rales, wheezing or rhonchi  ABDOMEN: Soft, non-tender, non-distended MUSCULOSKELETAL:  No edema; No deformity  SKIN: Warm and dry NEUROLOGIC:  Alert and oriented x 3 PSYCHIATRIC:  Normal affect   ASSESSMENT:    1. Palpitations   2. Atypical chest pain    PLAN:    In order of problems listed above:  1. Palpitations.  I will ask her to wear monitor for 30 days to see exactly what kind of arrhythmia if any with dealing with.  Will not initiate any therapy for now. 2. Atypical chest pain make me concerned especially after seeing her EKG showing possibility of anterior septal wall MI.  Therefore a device option will be  to pursue echocardiogram to check left ventricular ejection fraction.  We will also schedule her to have stress test which will be done in form of stress echocardiogram. 3. Dyslipidemia will get fasting lipid profile from her primary care physician.   Medication Adjustments/Labs and Tests Ordered: Current medicines are reviewed at length with the patient today.  Concerns regarding medicines are outlined above.  Orders Placed  This Encounter  Procedures  . Cardiac event monitor  . EKG 12-Lead  . ECHOCARDIOGRAM COMPLETE  . ECHOCARDIOGRAM STRESS TEST   No orders of the defined types were placed in this encounter.   Signed, Park Liter, MD, [facility]. 12/27/2018 10:37 AM    [facility]

## 2018-12-31 ENCOUNTER — Ambulatory Visit (INDEPENDENT_AMBULATORY_CARE_PROVIDER_SITE_OTHER): Payer: Medicare Other

## 2018-12-31 ENCOUNTER — Telehealth: Payer: Self-pay | Admitting: *Deleted

## 2018-12-31 DIAGNOSIS — R0789 Other chest pain: Secondary | ICD-10-CM

## 2018-12-31 DIAGNOSIS — R002 Palpitations: Secondary | ICD-10-CM

## 2018-12-31 NOTE — Telephone Encounter (Signed)
Pt came in to have monitor put on and brought her blood pressure cuff from home for [place] to check. Pt's blood pressure with manual cuff is 150/90 and with her automatic cuff it is 152/96. Informed pt this was right on and her cuff is very accurate.

## 2019-01-30 ENCOUNTER — Ambulatory Visit (INDEPENDENT_AMBULATORY_CARE_PROVIDER_SITE_OTHER): Payer: Medicare Other

## 2019-01-30 ENCOUNTER — Other Ambulatory Visit: Payer: Self-pay

## 2019-01-30 DIAGNOSIS — R0789 Other chest pain: Secondary | ICD-10-CM

## 2019-01-30 DIAGNOSIS — R002 Palpitations: Secondary | ICD-10-CM | POA: Diagnosis not present

## 2019-01-30 NOTE — Progress Notes (Signed)
Complete echocardiogram has been performed.  Jimmy Celine Dishman RDCS, RVT 

## 2019-01-31 ENCOUNTER — Ambulatory Visit (INDEPENDENT_AMBULATORY_CARE_PROVIDER_SITE_OTHER): Payer: Medicare Other

## 2019-01-31 DIAGNOSIS — R002 Palpitations: Secondary | ICD-10-CM

## 2019-01-31 DIAGNOSIS — R0789 Other chest pain: Secondary | ICD-10-CM | POA: Diagnosis not present

## 2019-01-31 NOTE — Progress Notes (Signed)
Stress echocardiogram has been performed.  Jimmy Sathvik Tiedt RDCS, RVT

## 2019-02-04 ENCOUNTER — Telehealth: Payer: Self-pay | Admitting: Emergency Medicine

## 2019-02-04 NOTE — Telephone Encounter (Signed)
Patient agreed to change appointment to televisit.

## 2019-02-06 ENCOUNTER — Other Ambulatory Visit: Payer: Self-pay

## 2019-02-06 ENCOUNTER — Telehealth (INDEPENDENT_AMBULATORY_CARE_PROVIDER_SITE_OTHER): Payer: Medicare Other | Admitting: Cardiology

## 2019-02-06 VITALS — BP 124/80 | Wt 147.0 lb

## 2019-02-06 DIAGNOSIS — R002 Palpitations: Secondary | ICD-10-CM

## 2019-02-06 DIAGNOSIS — R0789 Other chest pain: Secondary | ICD-10-CM | POA: Diagnosis not present

## 2019-02-06 DIAGNOSIS — E785 Hyperlipidemia, unspecified: Secondary | ICD-10-CM | POA: Diagnosis not present

## 2019-02-06 NOTE — Patient Instructions (Addendum)
Medication Instructions:  Your physician recommends that you continue on your current medications as directed. Please refer to the Current Medication list given to you today.  If you need a refill on your cardiac medications before your next appointment, please call your pharmacy.   Lab work: None.  If you have labs (blood work) drawn today and your tests are completely normal, you will receive your results only by: . MyChart Message (if you have MyChart) OR . A paper copy in the mail If you have any lab test that is abnormal or we need to change your treatment, we will call you to review the results.  Testing/Procedures: None.   Follow-Up: At [facility], you and your health needs are our priority.  As part of our continuing mission to provide you with exceptional heart care, we have created designated Provider Care Teams.  These Care Teams include your primary Cardiologist (physician) and Advanced Practice Providers (APPs -  Physician Assistants and Nurse Practitioners) who all work together to provide you with the care you need, when you need it. You will need a follow up appointment in 3 months.  Please call our office 2 months in advance to schedule this appointment.  You may see No primary care provider on file. or another member of our [facility] Provider Team in : Brian Munley, MD . Rajan Revankar, MD  Any Other Special Instructions Will Be Listed Below (If Applicable).     

## 2019-02-06 NOTE — Progress Notes (Signed)
Video conferencing Evaluation Performed:  Follow-up visit  This visit type was conducted due to national recommendations for restrictions regarding the COVID-19 Pandemic (e.g. social distancing).  This format is felt to be most appropriate for this patient at this time.  All issues noted in this document were discussed and addressed.  No physical exam was performed (except for noted visual exam findings with Video Visits).  Please refer to the patient's chart (MyChart message for video visits and phone note for telephone visits) for the patient's consent to telehealth for Rebecca Montgomery.  Date:  02/06/2019  ID: Jodine, [place] 06/30/1956, MRN [id number]   Patient Location:  [place] [address]   Provider location:   [place] [facility]  PCP:  Patient, No Pcp Per  Cardiologist:  Jenne Campus, MD     Chief Complaint: Still got some palpitations  History of Present Illness:    Rebecca Montgomery is a 63 y.o. female  who presents via audio/video conferencing for a telehealth visit today.  She does have some palpitations.  We did echocardiogram which showed preserved left ventricular ejection fraction.  Stress test was also done which was normal.  She did wear monitor which showed some extrasystole but nothing worrisome.  Overall she is doing well described to have episode of palpitations.  We did talk about options for this situation I offered her a small dose of beta-blocker I will ask her to start taking 12.5 mg metoprolol twice daily.  Also asked her to take it on as-needed basis however I want her to try first make sure she tolerates this medication well.  We did talk also about coronavirus situation I advised her strongly to stay at home and she does not.  At the same time I want her to exercise on the regular basis.  She describes episodes of some allergies which is probably related to pollen as well as to some fire aunts.  She said sometimes she has to take  prednisone for it when she taken her heart palpitations more.  And I told her this is probably because of prednisone.   The patient does not symptoms concerning for COVID-19 infection (fever, chills, cough, or new SHORTNESS OF BREATH).    Prior CV studies:   The following studies were reviewed today:  Echocardiogram stress test as well as monitor has been reviewed.     Past Medical History:  Diagnosis Date  . Acid reflux   . Back pain   . Basal cell adenocarcinoma   . Concussion 12/2000  . Headache   . Hypercholesteremia   . Neck pain   . Osteoporosis     Past Surgical History:  Procedure Laterality Date  . ABDOMINAL HYSTERECTOMY    . EYE SURGERY Right   . HERNIA REPAIR    . KNEE SURGERY Right   . WRIST SURGERY Right      Current Meds  Medication Sig  . acyclovir (ZOVIRAX) 400 MG tablet Take 400 mg by mouth 2 (two) times daily.   Marland Kitchen aspirin 81 MG tablet Take 81 mg by mouth daily.  Marland Kitchen EPINEPHrine 0.3 mg/0.3 mL IJ SOAJ injection INJECT CONTENTS OF 1 PEN INTO OUTER THIGH FOR SEVERE ALLERGIC REACTION. CALL 911 AFTER USE.  Marland Kitchen omeprazole (PRILOSEC) 20 MG capsule Take 20 mg by mouth daily.   Marland Kitchen PREDNISONE PO Take by mouth. Emergency pack, taper starting at 60 mg      Family History: The patient's family history  includes Congestive Heart Failure in her father; Dementia in her mother; Diabetes in her sister and sister; Heart defect in her daughter; Heart failure in her mother; Hyperlipidemia in her brother, brother, sister, and sister; Stroke in her brother and brother.   ROS:   Please see the history of present illness.     All other systems reviewed and are negative.   Labs/Other Tests and Data Reviewed:     Recent Labs: 07/17/2018: BUN 9; Creatinine, Ser 0.80; Hemoglobin 12.6; Potassium 3.9; Sodium 139 08/05/2018: TSH 2.460  Recent Lipid Panel No results found for: CHOL, TRIG, HDL, CHOLHDL, VLDL, LDLCALC, LDLDIRECT    Exam:    Vital Signs:  Wt 141 lb (64 kg)    BMI 24.20 kg/m    Well nourished, well developed female in no acute distress. Denies having any swelling affect mood is normal.  She looks good overall.  Diagnosis for this visit:   1. Palpitations   2. Atypical chest pain   3. Dyslipidemia      ASSESSMENT & PLAN:    1.  Palpitations: Extrasystole noted on the monitor.  I offered her a small dose of beta-blocker and she would like to try it.  I told her this is not serious. 2.  Atypical chest pain.  Stress test negative will encourage her to exercise on the regular basis modify risk factors for coronary artery disease. 3.  Dyslipidemia we will continue present management.  COVID-19 Education: The signs and symptoms of COVID-19 were discussed with the patient and how to seek care for testing (follow up with PCP or arrange E-visit).  The importance of social distancing was discussed today.  Patient Risk:   After full review of this patients clinical status, I feel that they are at least moderate risk at this time.  Time:   Today, I have spent 16 minutes with the patient with telehealth technology discussing pt health issues.     Medication Adjustments/Labs and Tests Ordered: Current medicines are reviewed at length with the patient today.  Concerns regarding medicines are outlined above.  No orders of the defined types were placed in this encounter.  Medication changes: No orders of the defined types were placed in this encounter.    Disposition: Follow-up in about 3 months  Signed, Park Liter, MD, [facility] 02/06/2019 11:47 AM    [place]

## 2019-02-07 ENCOUNTER — Telehealth: Payer: Self-pay | Admitting: *Deleted

## 2019-02-07 ENCOUNTER — Ambulatory Visit: Payer: Medicare Other | Admitting: Cardiology

## 2019-02-07 NOTE — Telephone Encounter (Signed)
Pt states that Dr. Raliegh Ip wanted to put pt on a low dose beta blocker but has not been sent in. PLease advise

## 2019-02-09 NOTE — Telephone Encounter (Signed)
Can we send rx for Metoprolol tartrate 12.5 mg po bid please

## 2019-02-10 MED ORDER — METOPROLOL TARTRATE 25 MG PO TABS: 12.5000 mg | ORAL_TABLET | Freq: Two times a day (BID) | ORAL | 0 refills | Status: DC

## 2019-02-10 NOTE — Telephone Encounter (Signed)
Metoprolol tartrate 12.5 mg twice daily prescription sent in.

## 2019-03-27 ENCOUNTER — Telehealth: Payer: Self-pay | Admitting: Cardiology

## 2019-03-27 NOTE — Telephone Encounter (Signed)
Please call patient Friday, May 15 between 1-2pm only!!!  Patient states she believes since starting the metoprolol, she is very fatigued, confused, and blood pressure is 90/54 with the bottom number never above 60. She also has gotten a large appetite but is maintaining her weight. She is working so can only be called from 1pm - 2pm tomorrow. Please advise.

## 2019-03-28 NOTE — Telephone Encounter (Signed)
Phoned patient who reports significant side effects since starting metoprolol 12.5 mg BID in March 2020. She reports fatigue, difficulty focusing at her new job, and lethargy. She has felt improvement in irregular and skipped heart beats, they have decreased significantly.     Her BP's have been between 90/50 and 106/65 with HR's in the 60's.    She denies edema, shortness of breath, chest pain/pressure or tightness.   pls advise, tx!

## 2019-03-31 NOTE — Telephone Encounter (Signed)
Left voice message asking patient to call office to discuss Dr. Wendy Poet new orders.

## 2019-03-31 NOTE — Telephone Encounter (Signed)
Pt called back. Please call her today

## 2019-03-31 NOTE — Telephone Encounter (Signed)
Ask her to stop metoprolol, and if she is willing we can try Cardiazem CD 120 mg po qd

## 2019-04-01 MED ORDER — DILTIAZEM HCL ER COATED BEADS 120 MG PO CP24: 120.0000 mg | ORAL_CAPSULE | Freq: Every day | ORAL | 1 refills | Status: DC

## 2019-04-01 NOTE — Addendum Note (Signed)
Addended by: Ashok Norris on: 04/01/2019 03:08 PM   Modules accepted: Orders

## 2019-04-01 NOTE — Telephone Encounter (Signed)
Phoned patient with new medication/prescription orders. She reports having been bitten by fire ants to which she has an anaphyllactic reaction...she also has serum sickness. She was seen by another provider who prescribed high doses of prednisone x 16 days minimum. She wants to know if its OK to dc metoprolol and start Cardizem while on prednisone.   pls advise, tx

## 2019-04-01 NOTE — Telephone Encounter (Signed)
Yes it is ok to do it

## 2019-04-01 NOTE — Telephone Encounter (Signed)
Patient called back I informed her that Dr. Agustin Cree said it is okay to stop metoprolol and start cardizem while on prednisone. Patient verbally understands. She will proceed with stopping metoprolol and start cardizem 120 mg daily, RX sent in. No further questions.

## 2019-04-01 NOTE — Telephone Encounter (Signed)
Left message for patient to return call.

## 2019-05-01 DIAGNOSIS — Z79899 Other long term (current) drug therapy: Secondary | ICD-10-CM

## 2019-05-01 HISTORY — DX: Other long term (current) drug therapy: Z79.899

## 2019-05-12 ENCOUNTER — Telehealth (INDEPENDENT_AMBULATORY_CARE_PROVIDER_SITE_OTHER): Payer: Medicare Other | Admitting: Cardiology

## 2019-05-12 ENCOUNTER — Other Ambulatory Visit: Payer: Self-pay

## 2019-05-12 VITALS — BP 140/85 | Wt 136.0 lb

## 2019-05-12 DIAGNOSIS — R002 Palpitations: Secondary | ICD-10-CM

## 2019-05-12 DIAGNOSIS — R0789 Other chest pain: Secondary | ICD-10-CM

## 2019-05-12 DIAGNOSIS — E785 Hyperlipidemia, unspecified: Secondary | ICD-10-CM

## 2019-05-12 NOTE — Progress Notes (Signed)
Virtual Visit via Video Note   This visit type was conducted due to national recommendations for restrictions regarding the COVID-19 Pandemic (e.g. social distancing) in an effort to limit this patient's exposure and mitigate transmission in our community.  Due to her co-morbid illnesses, this patient is at least at moderate risk for complications without adequate follow up.  This format is felt to be most appropriate for this patient at this time.  All issues noted in this document were discussed and addressed.  A limited physical exam was performed with this format.  Please refer to the patient's chart for her consent to telehealth for [facility]  Evaluation Performed:  Follow-up visit  This visit type was conducted due to national recommendations for restrictions regarding the COVID-19 Pandemic (e.g. social distancing).  This format is felt to be most appropriate for this patient at this time.  All issues noted in this document were discussed and addressed.  No physical exam was performed (except for noted visual exam findings with Video Visits).  Please refer to the patient's chart (MyChart message for video visits and phone note for telephone visits) for the patient's consent to telehealth for [facility].  Date:  05/12/2019  ID: Rebecca Montgomery, DOB 1956-10-30, MRN [id number]   Patient Location: [place] [address]   Provider location:   [place] [facility]  PCP:  Patient, No Pcp Per  Cardiologist:  Jenne Campus, MD     Chief Complaint: Doing fine from palpitations point of view but yesterday have anaphylactic reaction  History of Present Illness:    Rebecca Montgomery is a 63 y.o. female  who presents via audio/video conferencing for a telehealth visit today.  With palpitations.  I put monitor on her which show some infrequent ectopies.  That being initially managed with beta-blocker however she got difficulty tolerating this medication therefore  we will switch her to Cardizem.  From that aspect she seems to be doing well but she feels depressed and she thinks may be related to Cardizem.  I told him that calcium channel blocker like Cardizem do not have that side effect but she is still asking me if she need to be taking that medication.  Yesterday she was stung by some insect around her eyelid on the left side and up having swelling as well as shortness of breath.  Despite of the fact that she took Benadryl she ended up coming to the emergency room.  She required Kenalog shot and now she is on prednisone , she feels great on steroids.  So she is doing very well now.   The patient does not have symptoms concerning for COVID-19 infection (fever, chills, cough, or new SHORTNESS OF BREATH).    Prior CV studies:   The following studies were reviewed today:       Past Medical History:  Diagnosis Date  . Acid reflux   . Back pain   . Basal cell adenocarcinoma   . Concussion 12/2000  . Headache   . Hypercholesteremia   . Neck pain   . Osteoporosis     Past Surgical History:  Procedure Laterality Date  . ABDOMINAL HYSTERECTOMY    . EYE SURGERY Right   . HERNIA REPAIR    . KNEE SURGERY Right   . WRIST SURGERY Right      Current Meds  Medication Sig  . aspirin 81 MG tablet Take 81 mg by mouth daily.  Marland Kitchen diltiazem (CARDIZEM  CD) 120 MG 24 hr capsule Take 1 capsule (120 mg total) by mouth daily.  Marland Kitchen EPINEPHrine 0.3 mg/0.3 mL IJ SOAJ injection INJECT CONTENTS OF 1 PEN INTO OUTER THIGH FOR SEVERE ALLERGIC REACTION. CALL 911 AFTER USE.  Marland Kitchen omeprazole (PRILOSEC) 20 MG capsule Take 20 mg by mouth daily.   . predniSONE (DELTASONE) 50 MG tablet Take 50 mg by mouth daily with breakfast.      Family History: The patient's family history includes Congestive Heart Failure in her father; Dementia in her mother; Diabetes in her sister and sister; Heart defect in her daughter; Heart failure in her mother; Hyperlipidemia in her brother,  brother, sister, and sister; Stroke in her brother and brother.   ROS:   Please see the history of present illness.     All other systems reviewed and are negative.   Labs/Other Tests and Data Reviewed:     Recent Labs: 07/17/2018: BUN 9; Creatinine, Ser 0.80; Hemoglobin 12.6; Potassium 3.9; Sodium 139 08/05/2018: TSH 2.460  Recent Lipid Panel No results found for: CHOL, TRIG, HDL, CHOLHDL, VLDL, LDLCALC, LDLDIRECT    Exam:    Vital Signs:  BP 140/85   Wt 136 lb (61.7 kg)   BMI 20.68 kg/m     Wt Readings from Last 3 Encounters:  05/12/19 136 lb (61.7 kg)  02/06/19 147 lb (66.7 kg)  12/27/18 153 lb 6.4 oz (69.6 kg)     Well nourished, well developed in no acute distress. Alert oriented x3 not in distress quite cheerful we do have a video conference that she is at home and in our office in [place]  Diagnosis for this visit:   1. Palpitations   2. Atypical chest pain   3. Dyslipidemia      ASSESSMENT & PLAN:    1.  Palpitations: Overall doing very well and she actually wants to stop Cardizem.  I told her she can do that with understanding if palpitation recurs she need to go back on the medication.  I think Cardizem make her depressed. 2.  Atypical chest pain denies having any. 3.  Dyslipidemia we will get copy from her primary care physician of her fasting lipid profile  COVID-19 Education: The signs and symptoms of COVID-19 were discussed with the patient and how to seek care for testing (follow up with PCP or arrange E-visit).  The importance of social distancing was discussed today.  Patient Risk:   After full review of this patients clinical status, I feel that they are at least moderate risk at this time.  Time:   Today, I have spent 15 minutes with the patient with telehealth technology discussing pt health issues.  I spent 5 minutes reviewing her chart before the visit.  Visit was finished at 11:13 AM.    Medication Adjustments/Labs and Tests Ordered:  Current medicines are reviewed at length with the patient today.  Concerns regarding medicines are outlined above.  No orders of the defined types were placed in this encounter.  Medication changes: No orders of the defined types were placed in this encounter.    Disposition: Follow-up in 3 months  Signed, Park Liter, MD, [facility] 05/12/2019 [place]

## 2019-05-12 NOTE — Patient Instructions (Signed)
Medication Instructions:  Your physician recommends that you continue on your current medications as directed. Please refer to the Current Medication list given to you today.  If you need a refill on your cardiac medications before your next appointment, please call your pharmacy.   Lab work: None.  If you have labs (blood work) drawn today and your tests are completely normal, you will receive your results only by: . MyChart Message (if you have MyChart) OR . A paper copy in the mail If you have any lab test that is abnormal or we need to change your treatment, we will call you to review the results.  Testing/Procedures: None.   Follow-Up: At [facility], you and your health needs are our priority.  As part of our continuing mission to provide you with exceptional heart care, we have created designated Provider Care Teams.  These Care Teams include your primary Cardiologist (physician) and Advanced Practice Providers (APPs -  Physician Assistants and Nurse Practitioners) who all work together to provide you with the care you need, when you need it. You will need a follow up appointment in 5 months.  Please call our office 2 months in advance to schedule this appointment.  You may see No primary care provider on file. or another member of our [facility] Provider Team in [place]: Brian Munley, MD . Rajan Revankar, MD  Any Other Special Instructions Will Be Listed Below (If Applicable).    

## 2019-10-03 ENCOUNTER — Other Ambulatory Visit: Payer: Self-pay

## 2019-10-03 ENCOUNTER — Ambulatory Visit (INDEPENDENT_AMBULATORY_CARE_PROVIDER_SITE_OTHER): Payer: Medicare Other | Admitting: Cardiology

## 2019-10-03 ENCOUNTER — Encounter: Payer: Self-pay | Admitting: Cardiology

## 2019-10-03 VITALS — BP 120/62 | HR 87 | Ht 68.0 in | Wt 145.6 lb

## 2019-10-03 DIAGNOSIS — R0789 Other chest pain: Secondary | ICD-10-CM

## 2019-10-03 DIAGNOSIS — R002 Palpitations: Secondary | ICD-10-CM | POA: Diagnosis not present

## 2019-10-03 DIAGNOSIS — E785 Hyperlipidemia, unspecified: Secondary | ICD-10-CM | POA: Diagnosis not present

## 2019-10-03 NOTE — Progress Notes (Signed)
Cardiology Office Note:    Date:  10/03/2019   ID:  Rebecca Montgomery, DOB 12-15-1955, MRN [id number]  PCP:  Patient, No Pcp Per  Cardiologist:  Jenne Campus, MD    Referring MD: No ref. provider found   Chief Complaint  Patient presents with  . Follow-up  Cardiac wise doing well  History of Present Illness:    Rebecca Montgomery is a 63 y.o. female with past medical history significant for palpitations,, monitor showed some infrequent ectopy.  Initially we tried beta-blocker however she developed some difficulty tolerating this medication that she was switched to Cardizem she took Cardizem make her depressed and she discontinued this medication now she is without anything and seems to be doing well.  The biggest problem that she have is an allergy and she is being followed by allergist t she required very frequent courses of prednisone which of course make me concerned.  Past Medical History:  Diagnosis Date  . Acid reflux   . Back pain   . Basal cell adenocarcinoma   . Concussion 12/2000  . Headache   . Hypercholesteremia   . Neck pain   . Osteoporosis     Past Surgical History:  Procedure Laterality Date  . ABDOMINAL HYSTERECTOMY    . EYE SURGERY Right   . HERNIA REPAIR    . KNEE SURGERY Right   . WRIST SURGERY Right     Current Medications: Current Meds  Medication Sig  . aspirin 81 MG tablet Take 81 mg by mouth daily.  Marland Kitchen EPINEPHrine 0.3 mg/0.3 mL IJ SOAJ injection INJECT CONTENTS OF 1 PEN INTO OUTER THIGH FOR SEVERE ALLERGIC REACTION. CALL 911 AFTER USE.  Marland Kitchen omeprazole (PRILOSEC) 20 MG capsule Take 20 mg by mouth daily.   . predniSONE (DELTASONE) 50 MG tablet Take 50 mg by mouth as needed.      Allergies:   Bee venom, Lidocaine, and Shellfish allergy   Social History   Socioeconomic History  . Marital status: Divorced    Spouse name: Not on file  . Number of children: 2  . Years of education: masters  . Highest education level: Not on file  Occupational  History  . Occupation: Retired  Scientific laboratory technician  . Financial resource strain: Not on file  . Food insecurity    Worry: Not on file    Inability: Not on file  . Transportation needs    Medical: Not on file    Non-medical: Not on file  Tobacco Use  . Smoking status: Never Smoker  . Smokeless tobacco: Never Used  Substance and Sexual Activity  . Alcohol use: Never    Frequency: Never  . Drug use: Never  . Sexual activity: Not on file  Lifestyle  . Physical activity    Days per week: Not on file    Minutes per session: Not on file  . Stress: Not on file  Relationships  . Social Herbalist on phone: Not on file    Gets together: Not on file    Attends religious service: Not on file    Active member of club or organization: Not on file    Attends meetings of clubs or organizations: Not on file    Relationship status: Not on file  Other Topics Concern  . Not on file  Social History Narrative   Lives at home alone.   Right-handed.   Glass of tea in the morning.      Family History:  The patient's family history includes Congestive Heart Failure in her father; Dementia in her mother; Diabetes in her sister and sister; Heart defect in her daughter; Heart failure in her mother; Hyperlipidemia in her brother, brother, sister, and sister; Stroke in her brother and brother. ROS:   Please see the history of present illness.    All 14 point review of systems negative except as described per history of present illness  EKGs/Labs/Other Studies Reviewed:      Recent Labs: No results found for requested labs within last 8760 hours.  Recent Lipid Panel No results found for: CHOL, TRIG, HDL, CHOLHDL, VLDL, LDLCALC, LDLDIRECT  Physical Exam:    VS:  BP 120/62   Pulse 87   Ht 5\' 8"  (1.727 m)   Wt 145 lb 9.6 oz (66 kg)   SpO2 97%   BMI 22.14 kg/m     Wt Readings from Last 3 Encounters:  10/03/19 145 lb 9.6 oz (66 kg)  05/12/19 136 lb (61.7 kg)  02/06/19 147 lb (66.7  kg)     GEN:  Well nourished, well developed in no acute distress HEENT: Normal NECK: No JVD; No carotid bruits LYMPHATICS: No lymphadenopathy CARDIAC: RRR, no murmurs, no rubs, no gallops RESPIRATORY:  Clear to auscultation without rales, wheezing or rhonchi  ABDOMEN: Soft, non-tender, non-distended MUSCULOSKELETAL:  No edema; No deformity  SKIN: Warm and dry LOWER EXTREMITIES: no swelling NEUROLOGIC:  Alert and oriented x 3 PSYCHIATRIC:  Normal affect   ASSESSMENT:    1. Dyslipidemia   2. Palpitations   3. Atypical chest pain    PLAN:    In order of problems listed above:  1. Dyslipidemia will call primary care physician to get her fasting lipid profile. 2. Palpitations very few and far in between she is happy the way she feels does not want to go on any medications 3. Atypical chest pain doing well   Medication Adjustments/Labs and Tests Ordered: Current medicines are reviewed at length with the patient today.  Concerns regarding medicines are outlined above.  No orders of the defined types were placed in this encounter.  Medication changes: No orders of the defined types were placed in this encounter.   Signed, Park Liter, MD, [facility] 10/03/2019 2:44 PM    [place]

## 2019-10-03 NOTE — Addendum Note (Signed)
Addended by: Ashok Norris on: 10/03/2019 02:53 PM   Modules accepted: Orders

## 2019-10-03 NOTE — Patient Instructions (Signed)
Medication Instructions:  Your physician recommends that you continue on your current medications as directed. Please refer to the Current Medication list given to you today.  *If you need a refill on your cardiac medications before your next appointment, please call your pharmacy*  Lab Work: None.  If you have labs (blood work) drawn today and your tests are completely normal, you will receive your results only by: . MyChart Message (if you have MyChart) OR . A paper copy in the mail If you have any lab test that is abnormal or we need to change your treatment, we will call you to review the results.  Testing/Procedures: Your physician has requested that you have an echocardiogram. Echocardiography is a painless test that uses sound waves to create images of your heart. It provides your doctor with information about the size and shape of your heart and how well your heart's chambers and valves are working. This procedure takes approximately one hour. There are no restrictions for this procedure.    Follow-Up: At [facility], you and your health needs are our priority.  As part of our continuing mission to provide you with exceptional heart care, we have created designated Provider Care Teams.  These Care Teams include your primary Cardiologist (physician) and Advanced Practice Providers (APPs -  Physician Assistants and Nurse Practitioners) who all work together to provide you with the care you need, when you need it.  Your next appointment:   6 months  The format for your next appointment:   In Person  Provider:   Robert Krasowski, MD  Other Instructions   Echocardiogram An echocardiogram is a procedure that uses painless sound waves (ultrasound) to produce an image of the heart. Images from an echocardiogram can provide important information about:  Signs of coronary artery disease (CAD).  Aneurysm detection. An aneurysm is a weak or damaged part of an artery wall that  bulges out from the normal force of blood pumping through the body.  Heart size and shape. Changes in the size or shape of the heart can be associated with certain conditions, including heart failure, aneurysm, and CAD.  Heart muscle function.  Heart valve function.  Signs of a past heart attack.  Fluid buildup around the heart.  Thickening of the heart muscle.  A tumor or infectious growth around the heart valves. Tell a health care provider about:  Any allergies you have.  All medicines you are taking, including vitamins, herbs, eye drops, creams, and over-the-counter medicines.  Any blood disorders you have.  Any surgeries you have had.  Any medical conditions you have.  Whether you are pregnant or may be pregnant. What are the risks? Generally, this is a safe procedure. However, problems may occur, including:  Allergic reaction to dye (contrast) that may be used during the procedure. What happens before the procedure? No specific preparation is needed. You may eat and drink normally. What happens during the procedure?   An IV tube may be inserted into one of your veins.  You may receive contrast through this tube. A contrast is an injection that improves the quality of the pictures from your heart.  A gel will be applied to your chest.  A wand-like tool (transducer) will be moved over your chest. The gel will help to transmit the sound waves from the transducer.  The sound waves will harmlessly bounce off of your heart to allow the heart images to be captured in real-time motion. The images will be recorded   on a computer. The procedure may vary among health care providers and hospitals. What happens after the procedure?  You may return to your normal, everyday life, including diet, activities, and medicines, unless your health care provider tells you not to do that. Summary  An echocardiogram is a procedure that uses painless sound waves (ultrasound) to produce  an image of the heart.  Images from an echocardiogram can provide important information about the size and shape of your heart, heart muscle function, heart valve function, and fluid buildup around your heart.  You do not need to do anything to prepare before this procedure. You may eat and drink normally.  After the echocardiogram is completed, you may return to your normal, everyday life, unless your health care provider tells you not to do that. This information is not intended to replace advice given to you by your health care provider. Make sure you discuss any questions you have with your health care provider. Document Released: 10/27/2000 Document Revised: 02/20/2019 Document Reviewed: 12/02/2016 Elsevier Patient Education  2020 Elsevier Inc.   

## 2020-01-26 DIAGNOSIS — L03116 Cellulitis of left lower limb: Secondary | ICD-10-CM

## 2020-01-26 HISTORY — DX: Cellulitis of left lower limb: L03.116

## 2020-01-30 ENCOUNTER — Other Ambulatory Visit: Payer: Self-pay

## 2020-01-30 ENCOUNTER — Ambulatory Visit (INDEPENDENT_AMBULATORY_CARE_PROVIDER_SITE_OTHER): Payer: Medicare PPO

## 2020-01-30 DIAGNOSIS — R002 Palpitations: Secondary | ICD-10-CM | POA: Diagnosis not present

## 2020-01-30 DIAGNOSIS — R0789 Other chest pain: Secondary | ICD-10-CM | POA: Diagnosis not present

## 2020-01-30 NOTE — Progress Notes (Signed)
Complete echocardiogram has been performed.  Jimmy Ellaree Gear RDCS, RVT 

## 2020-02-05 ENCOUNTER — Telehealth: Payer: Self-pay | Admitting: Emergency Medicine

## 2020-02-05 DIAGNOSIS — E785 Hyperlipidemia, unspecified: Secondary | ICD-10-CM

## 2020-02-05 NOTE — Telephone Encounter (Signed)
Called patient to give her results of echo. During call she informs me or last ldl was over 200. Results are in care everywhere with [facility]. She said she is "on it", and will be watching her diet she doesn't want to start medication because she always has a really hard time with will inform Dr. Agustin Cree.

## 2020-02-06 NOTE — Telephone Encounter (Signed)
Cholesterol is very elevated.  That may indicate familiar hyperlipidemia.  This is a condition that need to be treated aggressively with statin if she agrees.  I will suggest to initiate Crestor 20 if she never had a problem with the medication.  If she agrees fasting lipid profile need to be checked within the next 6 weeks

## 2020-02-06 NOTE — Addendum Note (Signed)
Addended by: Ashok Norris on: 02/06/2020 02:25 PM   Modules accepted: Orders

## 2020-02-06 NOTE — Telephone Encounter (Signed)
Called patient and informed her per Dr. Agustin Cree if she doesn't want to take statins we will need to refer her to lipid clinic. She verbally understood. Referral placed.

## 2020-04-05 ENCOUNTER — Ambulatory Visit: Payer: Medicare PPO | Admitting: Internal Medicine

## 2020-04-05 ENCOUNTER — Encounter: Payer: Self-pay | Admitting: Internal Medicine

## 2020-04-05 ENCOUNTER — Other Ambulatory Visit: Payer: Self-pay

## 2020-04-05 VITALS — BP 119/80 | HR 68 | Temp 97.7°F | Ht 68.0 in | Wt 142.0 lb

## 2020-04-05 DIAGNOSIS — Z789 Other specified health status: Secondary | ICD-10-CM

## 2020-04-05 DIAGNOSIS — E7849 Other hyperlipidemia: Secondary | ICD-10-CM | POA: Diagnosis not present

## 2020-04-05 NOTE — Patient Instructions (Addendum)
  Dr. Debara Pickett recommends REPATHA (PCSK9). This is an injectable cholesterol medication self-administered once every 14 days. This medication will need prior approval with your insurance company, which we will work on. If the medication is not approved initially, we may need to do an appeal with your insurance. We will keep you updated on this process.   Administer medication in area of fatty tissue such as abdomen, outer thigh, back up of arm - and rotate site with each injection Store medication in refrigerator until ready to administer - allow to sit at room temp for 30 mins - 1 hour prior to injection Dispose of medication in a SHARPS container - your pharmacy should be able to direct you on this and proper disposal   If you need co-pay assistance, please look into the program at healthwellfoundation.org >> disease funds >> hypercholesterolemia. This is an online application or you can call to complete.   If you need a co-pay card for Repatha: http://aguilar-moyer.[email or web address]/ >> paying for Repatha  JENNA WILL BE IN TOUCH WITH YOU ONCE THIS HAS BEEN APPROVED   Dr. Debara Pickett recommends that you schedule a follow up visit with him the in the [place] in 4 months. Please have fasting blood work about 1 week prior to this visit and he will review the blood work results with you at your appointment.

## 2020-04-05 NOTE — Progress Notes (Signed)
LIPID CLINIC CONSULT NOTE  Chief Complaint:  Manage dyslipidemia  Primary Care Physician: Patient, No Pcp Per  Primary Cardiologist:  No primary care provider on file.  HPI:  Rebecca Montgomery is a 64 y.o. female who is being seen today for the evaluation of dyslipidemia at the request of Park Liter, MD.  This is a pleasant 64 year old female with longstanding history of dyslipidemia probably with the notable elevation in her cholesterol since she was told in her 69s.  She has a strong family history of heart disease and stroke including 2 brothers who had strokes in their 63s and 56s.  There is also family history of high triglycerides although her triglycerides now are better controlled but has been as high as 294 in the past.  Findings are highly concerning for familial hyperlipidemia or possibly familial combined hyperlipidemia.  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  She has had some worsening memory difficulty/memory fog on statins in the past and has been tried on several including atorvastatin, rosuvastatin and others.  She is averse to taking a statin medication.  Her most recent lipid showed total cholesterol 310, triglycerides 151, HDL 56 and LDL of 223.  She reports a very heart healthy diet in fact nearly vegan diet.  Her weight generally is around 120 pounds however recently she had a MRSA infection as well as an anaphylactic reaction to the modality vaccine which took several weeks to recover from.  She also has a history of anaphylaxis and serum sickness and allergies to shellfish and bee venom as well as fire ants.  PMHx:  Past Medical History:  Diagnosis Date  . Acid reflux   . Back pain   . Basal cell adenocarcinoma   . Concussion 12/2000  . Headache   . Hypercholesteremia   . Neck pain   . Osteoporosis     Past Surgical History:  Procedure Laterality Date  . ABDOMINAL HYSTERECTOMY    . EYE SURGERY Right   . HERNIA REPAIR    . KNEE  SURGERY Right   . WRIST SURGERY Right     FAMHx:  Family History  Problem Relation Age of Onset  . Dementia Mother        heavy smoker  . Heart failure Mother   . Congestive Heart Failure Father   . Diabetes Sister   . Hyperlipidemia Sister   . Stroke Brother   . Hyperlipidemia Brother   . Diabetes Sister   . Hyperlipidemia Sister   . Stroke Brother   . Hyperlipidemia Brother   . Heart defect Daughter     SOCHx:   reports that she has never smoked. She has never used smokeless tobacco. She reports that she does not drink alcohol or use drugs.  ALLERGIES:  Allergies  Allergen Reactions  . Bee Venom Other (See Comments)    Serum sickness reported with insect bite. Has been treated with prednisone only in the past per patient.  Also, fire ants.  Anaphylaxis reaction.  . Lidocaine Anaphylaxis    ALL CAINES PER PATIENT.  Marland Kitchen Shellfish Allergy Anaphylaxis    ROS: Pertinent items noted in HPI and remainder of comprehensive ROS otherwise negative.  HOME MEDS: Current Outpatient Medications on File Prior to Visit  Medication Sig Dispense Refill  . aspirin 81 MG tablet Take 81 mg by mouth daily.    Marland Kitchen EPINEPHrine 0.3 mg/0.3 mL IJ SOAJ injection INJECT CONTENTS OF 1 PEN INTO OUTER THIGH FOR  SEVERE ALLERGIC REACTION. CALL 911 AFTER USE.  1  . predniSONE (DELTASONE) 50 MG tablet Take 50 mg by mouth as needed.      No current facility-administered medications on file prior to visit.    LABS/IMAGING: No results found for this or any previous visit (from the past 48 hour(s)). No results found.  LIPID PANEL: No results found for: CHOL, TRIG, HDL, CHOLHDL, VLDL, LDLCALC, LDLDIRECT  WEIGHTS: Wt Readings from Last 3 Encounters:  04/05/20 142 lb (64.4 kg)  10/03/19 145 lb 9.6 oz (66 kg)  05/12/19 136 lb (61.7 kg)    VITALS: BP 119/80   Pulse 68   Temp 97.7 F (36.5 C)   Ht 5\' 8"  (1.727 m)   Wt 142 lb (64.4 kg)   SpO2 98%   BMI 21.59 kg/m   EXAM: General appearance:  alert and no distress Neck: no JVD, supple, symmetrical, trachea midline and thyroid not enlarged, symmetric, no tenderness/mass/nodules Lungs: clear to auscultation bilaterally Heart: regular rate and rhythm, S1, S2 normal, no murmur, click, rub or gallop Abdomen: soft, non-tender; bowel sounds normal; no masses,  no organomegaly Extremities: extremities normal, atraumatic, no cyanosis or edema and No tendon xanthomas, no corneal arcus Pulses: 2+ and symmetric Skin: Skin color, texture, turgor normal. No rashes or lesions Neurologic: Grossly normal Psych: Pleasant  EKG: Deferred  ASSESSMENT: 1. 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 Statin intolerance-memory loss 3. Family history of premature stroke  PLAN: 1.   Ms. Boschetti has a likely familial hyperlipidemia based on her high cholesterol and has had high triglycerides in the past as well.  There is a strong family history of this including early stroke in 2 brothers.  Unfortunate she cannot tolerate statins due to worsening memory loss and has a history of traumatic brain injury.  In addition she has multiple anaphylactic reactions and history of serum sickness.  We discussed possible treatment options including a PCSK9 inhibitor.  I think this is a good option.  Studies have shown no autoantibodies produced to that however I cannot say whether or not she would have an anaphylactic type reaction.  She is willing however to try the medication.  I think this is her best option to lower LDL cholesterol.  I would also recommend checking an LP(a) with her repeat labs since this is a significant stroke risk factor.  Thanks again for the kind referral.  Plan follow-up with me in about 3 to 4 months with repeat lipids.  Pixie Casino, MD, [facility] [place] Director of the Advanced Lipid Disorders &  Cardiovascular Risk Reduction Clinic Diplomate of the American Board of Clinical  Lipidology Attending Cardiologist  Direct Dial: [phone]  Fax: [phone]  Website:  [email or web address] Principato 04/05/2020, 10:46 AM

## 2020-04-09 ENCOUNTER — Telehealth: Payer: Self-pay | Admitting: Internal Medicine

## 2020-04-09 MED ORDER — REPATHA SURECLICK 140 MG/ML ~~LOC~~ SOAJ: 1.0000 | SUBCUTANEOUS | 11 refills | Status: DC

## 2020-04-09 NOTE — Telephone Encounter (Signed)
LM for patient that medication has been approved, Rx sent to [facility], and to reference AVS for info on med administration and co-pay assistance.   Rx(s) sent to pharmacy electronically.

## 2020-04-09 NOTE — Telephone Encounter (Signed)
PA for repatha sureclick submitted via CMM (Key: [id number])  Approved until 10/06/2020

## 2020-04-09 NOTE — Addendum Note (Signed)
Addended by: Fidel Levy on: 04/09/2020 12:54 PM   Modules accepted: Orders

## 2020-04-16 ENCOUNTER — Telehealth: Payer: Self-pay | Admitting: Internal Medicine

## 2020-04-16 NOTE — Telephone Encounter (Signed)
   Pt c/o medication issue:  1. Name of Medication:Evolocumab (REPATHA SURECLICK) 244 MG/ML SOAJ  2. How are you currently taking this medication (dosage and times per day)? Inject 1 Dose into the skin every 14 (fourteen) days.  3. Are you having a reaction (difficulty breathing--STAT)?   4. What is your medication issue? Pt said she reacts to any medications she takes. She is asking if she can have her first dose in the clinic. She said she doesn't have to be seen by any body. She is more comfortable if there's someone can see her if she goes anaphylactic shock. She said she needs to stay for an hour.

## 2020-04-16 NOTE — Telephone Encounter (Signed)
Is there any time available when this patient can do first injection in the office?

## 2020-04-16 NOTE — Telephone Encounter (Signed)
We can have patient here for 1st injection but we CAN NOT keep her here for an hour. We need the rooms for see other patients.

## 2020-04-19 NOTE — Telephone Encounter (Signed)
LM for patient that today or tomorrow at 3:30pm for Repatha injection with [facility] will work.   Patient had requested Wednesday but Erasmo Downer PharmD has [facility] clinic by herself this day and is not available. RN also has clinic all day on Wednesday onsite

## 2020-04-19 NOTE — Telephone Encounter (Signed)
Follow up  Patient states that she can come in today at 3:30 pm. Patient states to call if you have any further questions. She states that she will be able to answer her phone after 2:30 pm.

## 2020-04-19 NOTE — Telephone Encounter (Signed)
LM for patient that we can do first injection in office but NOT keep her for an hour - typically about 15-20 mins for monitoring. Asked for call back to discuss. If she would like this, need to coordinate with [facility] staff on availability

## 2020-04-19 NOTE — Telephone Encounter (Signed)
[facility] staff notified of patient coming in for repatha injection via secure chat

## 2020-04-19 NOTE — Telephone Encounter (Signed)
Follow up   Forwarded message to [place] via secure chat per the previous message. Eliezer Lofts will call patient back.

## 2020-04-19 NOTE — Telephone Encounter (Signed)
LM for patient to bring Repatha injection with her to office

## 2020-04-30 NOTE — Telephone Encounter (Signed)
Patient reports ADR with Repatha after leaving the office on 04/16/2020.    She he is afraid of side effects (headaches, aches, palpitations, etc...). Please call her to discuss alternative therapy or option of pre-treatment for Repatha.

## 2020-05-04 NOTE — Telephone Encounter (Signed)
I'm not sure she will be able to take anything - we can discuss this in the office at some point.  Dr Lemmie Evens

## 2020-05-04 NOTE — Telephone Encounter (Signed)
Routed to MD for any advice/recommendations.Marland Kitchen

## 2020-05-04 NOTE — Telephone Encounter (Signed)
LMTCB on [phone] [facility])

## 2020-05-05 NOTE — Telephone Encounter (Signed)
Patient returning call. 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

## 2020-05-06 DIAGNOSIS — T8069XA Other serum reaction due to other serum, initial encounter: Secondary | ICD-10-CM

## 2020-05-06 HISTORY — DX: Other serum reaction due to other serum, initial encounter: T80.69XA

## 2020-05-07 NOTE — Telephone Encounter (Signed)
Patient reports reaction to Repatha shortly after medication dose in the office - reports itching, hives within an hour. She took benadryl. She felt bad up until a few weeks ago.   Advised to discuss with Dr. Agustin Cree at appointment on 7/6 also     She is interested in dietary support/accountability - will refer to [place], Avelino Leeds She states she is very willing to adhere to a strict diet but is limited in options where she lives She would like some support and also a reason to come to [place] to do grocery shopping @ places with more nutritional options Fridays are the best day to reach patient/schedule appointments

## 2020-05-14 ENCOUNTER — Telehealth: Payer: Self-pay

## 2020-05-14 NOTE — Telephone Encounter (Signed)
Called patient to discuss interest in health coaching for dietary needs related to lowering cholesterol and weight maintenance. Patient is interested in health coaching for accountability purposes. Patient states they have the knowledge of healthy eating and has implemented healthier eating habits but cholesterol levels fluctuated.  Initial health coaching session scheduled for 05/25/20 at 4:30pm.

## 2020-05-18 ENCOUNTER — Ambulatory Visit: Payer: Medicare PPO | Admitting: Cardiology

## 2020-05-18 ENCOUNTER — Other Ambulatory Visit: Payer: Self-pay

## 2020-05-18 VITALS — Ht 68.0 in | Wt 141.0 lb

## 2020-05-18 DIAGNOSIS — E7849 Other hyperlipidemia: Secondary | ICD-10-CM | POA: Diagnosis not present

## 2020-05-18 DIAGNOSIS — K219 Gastro-esophageal reflux disease without esophagitis: Secondary | ICD-10-CM

## 2020-05-18 NOTE — Progress Notes (Signed)
Cardiology Office Note:    Date:  05/18/2020   ID:  Rebecca Montgomery, DOB 1956-07-14, MRN [id number]  PCP:  Patient, No Pcp Per  Cardiologist:  Jenne Campus, MD    Referring MD: No ref. provider found   No chief complaint on file. I am doing fine  History of Present Illness:    Rebecca Montgomery is a 64 y.o. female with what appears to be familial hyperlipidemia, history of GERD, serum sickness.  She has been referred to lipid clinic for management of her very high cholesterol.  She was given PCSK9 agent, sadly developed allergic reaction to it.  And she is not able to take it.  It is a very complicated situation.  She cannot tolerate statin she has multiple medication that she cannot take.  She will follow up with a lipid clinic to continue discussion about potentially using different medications.  Overall she is doing well she denies having chest pain tightness squeezing pressure burning chest.  She is trying to be active.  Past Medical History:  Diagnosis Date  . Acid reflux   . Back pain   . Basal cell adenocarcinoma   . Concussion 12/2000  . Headache   . Hypercholesteremia   . Neck pain   . Osteoporosis     Past Surgical History:  Procedure Laterality Date  . ABDOMINAL HYSTERECTOMY    . EYE SURGERY Right   . HERNIA REPAIR    . KNEE SURGERY Right   . WRIST SURGERY Right     Current Medications: Current Meds  Medication Sig  . aspirin 81 MG tablet Take 81 mg by mouth daily.  Marland Kitchen EPINEPHrine 0.3 mg/0.3 mL IJ SOAJ injection INJECT CONTENTS OF 1 PEN INTO OUTER THIGH FOR SEVERE ALLERGIC REACTION. CALL 911 AFTER USE.  . predniSONE (DELTASONE) 50 MG tablet Take 50 mg by mouth as needed.      Allergies:   Bee venom, Covid-19 (mrna) vaccine, Lidocaine, and Shellfish allergy   Social History   Socioeconomic History  . Marital status: Divorced    Spouse name: Not on file  . Number of children: 2  . Years of education: masters  . Highest education level: Not on file   Occupational History  . Occupation: Retired  Tobacco Use  . Smoking status: Never Smoker  . Smokeless tobacco: Never Used  Vaping Use  . Vaping Use: Never used  Substance and Sexual Activity  . Alcohol use: Never  . Drug use: Never  . Sexual activity: Not on file  Other Topics Concern  . Not on file  Social History Narrative   Lives at home alone.   Right-handed.   Glass of tea in the morning.    Social Determinants of Health   Financial Resource Strain:   . Difficulty of Paying Living Expenses:   Food Insecurity:   . Worried About Charity fundraiser in the Last Year:   . Arboriculturist in the Last Year:   Transportation Needs:   . Film/video editor (Medical):   Marland Kitchen Lack of Transportation (Non-Medical):   Physical Activity:   . Days of Exercise per Week:   . Minutes of Exercise per Session:   Stress:   . Feeling of Stress :   Social Connections:   . Frequency of Communication with Friends and Family:   . Frequency of Social Gatherings with Friends and Family:   . Attends Religious Services:   . Active Member of Clubs or Organizations:   .  Attends Archivist Meetings:   Marland Kitchen Marital Status:      Family History: The patient's family history includes Congestive Heart Failure in her father; Dementia in her mother; Diabetes in her sister and sister; Heart defect in her daughter; Heart failure in her mother; Hyperlipidemia in her brother, brother, sister, and sister; Stroke in her brother and brother. ROS:   Please see the history of present illness.    All 14 point review of systems negative except as described per history of present illness  EKGs/Labs/Other Studies Reviewed:      Recent Labs: No results found for requested labs within last 8760 hours.  Recent Lipid Panel No results found for: CHOL, TRIG, HDL, CHOLHDL, VLDL, LDLCALC, LDLDIRECT  Physical Exam:    VS:  Ht 5\' 8"  (1.727 m)   Wt 141 lb (64 kg)   BMI 21.44 kg/m     Wt Readings from  Last 3 Encounters:  05/18/20 141 lb (64 kg)  04/05/20 142 lb (64.4 kg)  10/03/19 145 lb 9.6 oz (66 kg)     GEN:  Well nourished, well developed in no acute distress HEENT: Normal NECK: No JVD; No carotid bruits LYMPHATICS: No lymphadenopathy CARDIAC: RRR, no murmurs, no rubs, no gallops RESPIRATORY:  Clear to auscultation without rales, wheezing or rhonchi  ABDOMEN: Soft, non-tender, non-distended MUSCULOSKELETAL:  No edema; No deformity  SKIN: Warm and dry LOWER EXTREMITIES: no swelling NEUROLOGIC:  Alert and oriented x 3 PSYCHIATRIC:  Normal affect   ASSESSMENT:    1. Familial hyperlipidemia    PLAN:    In order of problems listed above:  1. Familial hyperlipidemia: She is followed by lipid clinic.  Sadly intolerance to statin as well as PCSK9 agent.  She is scheduled to see dietitian to continue conversation about diet management and exercise management.  There was a time at the point that she was able to lower her LDL from 226 160. 2. GERD: Doing well from that point.  Denies having issue. 3. 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   Medication Adjustments/Labs and Tests Ordered: Current medicines are reviewed at length with the patient today.  Concerns regarding medicines are outlined above.  No orders of the defined types were placed in this encounter.  Medication changes: No orders of the defined types were placed in this encounter.   Signed, Park Liter, MD, [facility] 05/18/2020 9:19 AM    [place]

## 2020-05-18 NOTE — Patient Instructions (Signed)

## 2020-05-25 ENCOUNTER — Ambulatory Visit (INDEPENDENT_AMBULATORY_CARE_PROVIDER_SITE_OTHER): Payer: Medicare PPO

## 2020-05-25 ENCOUNTER — Other Ambulatory Visit: Payer: Self-pay

## 2020-05-25 DIAGNOSIS — Z0000 Encounter for general adult medical examination without abnormal findings: Secondary | ICD-10-CM

## 2020-05-25 NOTE — Progress Notes (Signed)
Appointment Outcome:  Completed Session #: Initial Health Coaching Visit  AGREEMENTS SECTION   Overall Goal(s): Within 3 months the patient would like to reduce their LDL levels, lose 10-12 pounds, increase exercise, and develop healthy eating habits.                                               Agreement/Action Steps:  Patient will begin shopping at grocery stores with healthier food options and begin meal prepping. Patient will work on portion control as well as watching for dietary carbohydrate and cholesterol intake. Patient will schedule meals throughout the day that will include a lunch break at work.   Patient will slowly increase physical activity from walking a mile a day within 15 minutes and gardening throughout the week.  Progress Notes:  Patient has been trying to implement healthy eating habits on their own. They have been keeping track of what to eat and what not to eat. Patient is currently walking a mile within 15 minutes with children in summer school in addition to gardening at home during leisure time.   Indicators of Success and Accountability:  Changing eating habits, increasing physical activity, meal prepping, enforce portion control, and scheduling meal times in order to reduce LDLs. . Readiness: Patient rates as a 5 in readiness to take action in achieving goals. . Strengths and Supports: Patient will draw on previous success of eating habits that she incorporated from losing weight and maintained keeping majority off. Patient will rely on friend and daughter as accountability partners. . Challenges and Barriers: Challenges that the patient face are the location of grocery stores with healthier food options, scheduling time to eat and time to prep meals.   Coaching Outcomes: Patient was given healthy mediteranian recipes to try, a grocery list, food journal for two weeks, and a guide regarding cholesterol and how to manage it with diet. Patient will utilize this  information as a guide to improve eating habits to reduce LDLs.  Patient stated they will purchase healthy foods on trips to [facility] when visiting Health Coach/Care Guide.   Patient will be going on vacation on Aug 2nd - 15th and will aim to incorporate a healthy diet during that time.  Patient is allergic to shellfish and Health Coach will avoid suggesting recipes with this ingredient.   Patient was made aware of the 9-inch plate method to manage portion control.

## 2020-06-08 ENCOUNTER — Other Ambulatory Visit: Payer: Self-pay

## 2020-06-08 ENCOUNTER — Ambulatory Visit (INDEPENDENT_AMBULATORY_CARE_PROVIDER_SITE_OTHER): Payer: Medicare PPO

## 2020-06-08 DIAGNOSIS — Z0000 Encounter for general adult medical examination without abnormal findings: Secondary | ICD-10-CM

## 2020-06-08 NOTE — Progress Notes (Signed)
Appointment Outcome:  Completed Session #: 1  AGREEMENTS SECTION   Overall Goal(s): Patient is interested in losing 10-12 pounds over the next 3 months. Increase physical activity to 150 minutes per week. Reduce LDLs.                                               Agreement/Action Steps:  Increase physical activity from 1 mile/day within 15 in addition to gardening Patient will meal prep on weekends Set up a routine schedule for eating 3 meals throughout the day Patient will use the 9 inch plate rule for portion control Patient will follow a healthier diet that includes soluble fiber, reduce red meat, increase in omega-3 and fruits/vegetables.  Progress Notes:  Patient has been able to incorporate meal prepping into her routine on Sundays. She was able to schedule routine meals to incorporate breakfast and lunch during work hours. Patient has incorporated new recipes into her diet to increase the intake of healthier foods. Patient has reduced red meat and increased fiber and omega-3 from various fish. Patient is working on portion control. Patient stated that she had trouble maintaining diet during stressful times of two friends passing. Patient expressed that she eats when she is stressed and is concerned that this will hinder her progress.   Patient informed me that she engages in physical activity doing yard work (push mowing 1 acre) each week. Patient has incorporated her 1 mile walk into her daily routine at work. Patient is still interested in increasing her physical activity.  Patient stated they have lost one pound in the last two weeks.   Patient is concerned about engaging in healthy eating and physical activity habits when on vacation for two weeks.  Indicators of Success and Accountability:  Patient will like to reduce her LDL before next visit with Dr. Debara Pickett on Sept 2. Readiness: Patient is in the action phase of incorporating health eating and physical activity choices into  her daily routine. Strengths and Supports: Patient has started incorporating healthy food choices into her diet before health coaching. 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 Patient has established a support/accountability system with friend and daughter. Challenges and Barriers: Patient identified barriers are time, stress eating, not knowing what is raising her LDL, and location of grocery stores.  Coaching Outcomes: Patient will begin to keep a journal of successes and challenges that she face with incorporating healthy eating habits into her daily diet. Patient will work with daughter on vacation to incorporate healthy eating choices. Patient will engage grandchildren in walking during visit to stay physically active. Patient will replace current food options with healthier ones (fruits/vegetables, yogurt, teas) when stressed/stressed eating. Patient will carve out an additional 15 minutes a day to get another mile of walking completed upon return from vacation. Patient will continue to enforce eating schedule. Patient will purchase healthy foods during visit to [facility] every two weeks.   Attempted: Fulfilled #- Patient is on track to implementing action steps co-created to achieve goal.

## 2020-06-09 ENCOUNTER — Telehealth: Payer: Self-pay

## 2020-06-09 DIAGNOSIS — Z0000 Encounter for general adult medical examination without abnormal findings: Secondary | ICD-10-CM

## 2020-06-09 NOTE — Telephone Encounter (Signed)
Called patient to ensure that their MyChart had been activated and to schedule/reschedule appointment for Care Guide. Patient is aware of next appointment.

## 2020-06-28 ENCOUNTER — Ambulatory Visit: Payer: Self-pay

## 2020-06-29 ENCOUNTER — Ambulatory Visit: Payer: Self-pay

## 2020-07-06 ENCOUNTER — Other Ambulatory Visit: Payer: Self-pay

## 2020-07-06 ENCOUNTER — Ambulatory Visit (INDEPENDENT_AMBULATORY_CARE_PROVIDER_SITE_OTHER): Payer: Medicare PPO

## 2020-07-06 DIAGNOSIS — Z0000 Encounter for general adult medical examination without abnormal findings: Secondary | ICD-10-CM

## 2020-07-06 NOTE — Progress Notes (Signed)
Appointment Outcome:  Completed, Session #: 2  AGREEMENTS SECTION   Overall Goal(s): Lose 10-12 pounds Increase physical activity Develop healthier eating habits Reduce LDLs  Agreement/Action Steps:  Increase 1.5 mile walk a day while at school by adding an additional 15 minute walk after work Continue gardening Continuing doing yard work [employer] and lunches for work Increase water intake    Progress Notes:  Patient recently returned from visiting family and was not able to meal prep. However, she was able to enforce portion control and choosing healthier food options when on the go. Patient stated that she has been more cautious about reading food labels so that she can be mindful of how much cholesterol, fats, and carbs she is consuming. Patient has reduced the amount of animal products that she consumes as well. Patient stated that she was able to walk a lot during her visit. These opportunities included walkign her grandchildren to the park, [employer], through amusement parks, and the zoo. Patient stated that she has lost 4 pounds over the course of a month.   Patient stated that she has one 30 minute break during work and it challenging to find something to eat healthy during that time or eat at all because of work responsibilities. Patient stated that she is not sedentary at her job and that she is constantly moving and engaged with children throughout the day. Patient is concerned that since school has started that her schedule will be a barrier to implementing healthy eating choices during the day. Patient stated that staff all day staff meetings will interfere with her ability to be as active during work hours.  Patient has a scheduled appt to have her LDL checked in Nov.  Indicators of Success and Accountability:  Patient is really interested in lowering her LDL over the course of 3 months. Patient would like to stay on her current trajectory of losing  weight. Readiness: Patient is a 5 on the readiness scale. She is in the action phase of achieving her goals. Strengths and Supports: Patient has been consistent with implementing action steps and continues to research information that assists her with improving her diet. Patient utilizes her free time and work hours to incorporate physical activity. Patient has the support of her daughter and friend for motivation. Challenges and Barriers: Currently, the patient is concerned about her work schedule interfering with her ability to to eat properly while at work. The extreme temperatures is a concern when it comes to gardening and yard work.  Coaching Outcomes:  Patient has set a new goal to start increasing her water intake during the day. Currently, she is drinking soda streams and unsweet teas. She would like to add a couple of quartz of water. Patient stated that she will start meal prepping on Sundays so that she can have breakfast and lunch ready for work to avoid eating unhealthy foods. Patient will continue to incorporate all other action steps as is, while including an addition 15 minutes of walking after work. When patient is working in the garden and doing yard work, the patient will incorporate 15-20 minute breaks. Patient will plan her daily walks accordingly. Patient is interesting in finding more recipes and healthy snacks to incorporate into her diet.  Attempted: Fulfilled - Patient has been able to incorporate all other action steps to achieve goals. Not met - Patient will start meal prepping this Sunday for work. Patient will increase water intake over the course of the next two  weeks.

## 2020-07-15 ENCOUNTER — Ambulatory Visit: Payer: Medicare PPO | Admitting: Internal Medicine

## 2020-07-22 ENCOUNTER — Ambulatory Visit (INDEPENDENT_AMBULATORY_CARE_PROVIDER_SITE_OTHER): Payer: Medicare PPO

## 2020-07-22 ENCOUNTER — Other Ambulatory Visit: Payer: Self-pay

## 2020-07-22 DIAGNOSIS — Z0000 Encounter for general adult medical examination without abnormal findings: Secondary | ICD-10-CM

## 2020-07-22 NOTE — Progress Notes (Signed)
Appointment Outcome:  Completed, Session #: 3  AGREEMENTS SECTION   Overall Goal(s): Lose 10-12 pounds Increase physical activity Maintain healthy eating habits Increase water intake Reduce LDL  Agreement/Action Steps:  Schedule meals at work (breakfast and lunch) Meal prep on weekends Portion control Increase PA form 1 mile/day to reach recommended time (150 minutes) Gardening/yard work  Progress Notes:  Patient has created a routine for eating breakfast and lunch at work: 6-6:30 - breakfast 8:50 - snack 11:15 - lunch 2:30 - snack  Patient has been able to incorporate health food options during meal and snack times. Patient has found way to substitute unhealthy ingredients with healthier options. Patient was unable to meal prep the entire two weeks due to becoming ill (serum sickness). Patient have been taking Prenisone. Patient questions if this medication raises cholesterol. Patient stated that they did eat some unhealthy food because of comfort during illness. Patient had a few days were they had limited activity during this time. Patient was able to push through work days to remain physically active. During this time, she is able to get in enough steps to cover 2-2.5 miles a day of walking.   Patient was not able to maintain walks after work because of illness. Prior to becoming ill, patient was able to garden, can tomatoes, and do yard work.   Patient has been incorporating non-dairy products, increased water intake, and limit her soda consumption. Patient reported they lost one pound during the two weeks.  Patient is utilizing her support system for encouragement and accountability (best friend and daughter). Both have adopted healthy lifestyles and motivate patient on her journey.  Indicators of Success and Accountability:  Patient would like to maintain health behaviors although she may become sick. Patient is aiming to lose the remaining 5-7 pounds in the next 2.5 months.   Readiness: Patient is in the action stage of behavior change. Strengths and Supports: Patient is relying on best friend and daughter as supports and accountability partners. Patient is making  Challenges and Barriers: Patient has serum sickness, which caused her to be physically unable to engage in extra physical activity.   Coaching Outcomes: Patient is interested in walking an additional 2-3 miles after work to reach the recommended minutes of physical activity. Patient wants to increase the amount of protein in her diet, while reducing the number of unhealthy carbs, soda, and dairy. Patient wants to work on meal prepping more to avoid comfort eating during times of illness and ensure that she eat balanced meals.   Patient will continue not to drink, increase water intake, and reduce the amount of soda she consumes.   Patient has signed up for a running club at her school for Tuesday and Thursday. This will give her an opportunity to get in more physical activity in addition to walking. Patient will continue to incorporate breaks during physical activity.     Attempted: Fulfilled #- Patient was able to increase water intake, maintain physical activity at work, enforce portion control, and meal schedule. Patient has lost a total of 5 pounds over 6 weeks. . Partial #- Patient attempted to meal prep, eat healthy throughout the entire two weeks, garden/yard work, and walk after work.   Note: Patient is working towards reducing LDL. Will not be evaluated until 09/23/20.

## 2020-07-29 ENCOUNTER — Telehealth: Payer: Self-pay

## 2020-07-29 DIAGNOSIS — Z0000 Encounter for general adult medical examination without abnormal findings: Secondary | ICD-10-CM

## 2020-07-29 NOTE — Telephone Encounter (Signed)
Called patient to inform her that Dr. Debara Pickett expressed that chronic use of Prednisone does raise cholesterol levels. This was a concern that the patient raised during our last health coaching session. Left a message for patient to return call if they had any further questions before next session.

## 2020-08-02 ENCOUNTER — Other Ambulatory Visit: Payer: Self-pay

## 2020-08-02 ENCOUNTER — Ambulatory Visit (INDEPENDENT_AMBULATORY_CARE_PROVIDER_SITE_OTHER): Payer: Medicare PPO

## 2020-08-02 DIAGNOSIS — Z0000 Encounter for general adult medical examination without abnormal findings: Secondary | ICD-10-CM

## 2020-08-02 NOTE — Progress Notes (Signed)
Appointment Outcome:  Completed, Session #: 4  AGREEMENTS SECTION   Overall Goal(s): Lose 10-12 pounds by October 26th Lower LDL by Nov 11th Increase exercise to meet the recommended 150 minutes per week                                          Agreement/Action Steps:  Scheduling meals while at work Meal prepping on weekends Reading food labels Increase exercise to meet recommended 150 minutes per week (gardening/yard work, walking mile after work, walking during work).  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 Patient is currently taking Prednisone and Benadryl. Patient was made aware that Care Guide reached out to Dr. Debara Pickett regarding the correlation between taking Prednisone and LDL cholesterol. Patient is aware that chronic use of this medication contributes to the elevation of cholesterol.  The ultimate goal is to reduce the patient's LDL level.  Patient has been pushing to get in her exercise throughout the two weeks. She was able to maintain her original routine of yard work, walking at work and walking after work until she encountered the fire ants during the second week until Sunday 08/01/20.  Patient is able to walk a mile within 15 minutes. Patient has been able to work her way up to walking 2 - 2.5 miles a day while at work, and one mile after work. Patient has begun to incorporate breaks into her exercise routine.     Patient has managed to choose healthier options during this time compared to the last episode with fire ants. Patient has utilized meal prepping and label reading to aid in this change. Patient is maintaining her eating routine at work with eating breakfast around 6am, snack before 9am, lunch at 11:15am, and snack at 2:30pm.  Patient has been canning her own vegetables, such as tomatoes. Patient has been making her own sauces, eating wheat-based products, non-dairy, and ensuring that she is reading the  ingredients on the food labels. Patient has been able to stop drinking soda and tea over the course of two weeks. Patient has increased their water intake instead.   Patient has been actively engaged with her support system (daughter and best friend). Patient states that they have been very encouraging with their health journey and they motivate her to stay on the right track.  Patient stated that they lost one pound but is certain that Prednisone is interfering with her weight lost, especially in the mid-section. Patient has lost a total of 5 pounds over the course of 6 weeks.    . Indicators of Success and Accountability:  Patient is working towards losing the remaining 5-7 pounds to meet her weight loss goal. Patient will sign up for a fitness program. at are the mile markers along your path to reaching your desired changes  . Readiness: Patient is in action stage of behavior change with both exercise and healthy eating. . Strengths and Supports: Patient has really focused on reading food labels and meal prepping. Patient is leaning on her support system during these times of sickness to stay on track. Patient is challenging herself to find creative ways to meet her goals.  . Challenges and Barriers: Patient is concerned that they will not be as active when her serum sickness is full blown. Patient hopes not to return to unhealthy eating habits  for comfort as she did during her last episode.  Coaching Outcomes: Patient will sit and use light weights when not feeling well to continue being physically active. Patient will continue to meal prep to aid in maintaining her eating schedule at work and to facilitate healthier eating choices.   Patient is interested in researching a variety of recipes to add to her options of healthier meal prepping.   Patient will engage in running/walking program with children after school on Tuesdays and Thursdays for one hour to increase the amount of exercise she is  getting within a week.  When well, patient will return to engaging in all of her action steps to maintain the recommended 150 minutes of exercise.     Attempted: Marland Kitchen Fulfilled - Patient is maintaining her eating schedule, meal prepping, and exercising by implementing her various action steps. Patient has increased exercise time over the course of the last two weeks.  . Deferred - Patient is waiting to get her LDL level check on Nov 11th. Patient will start exercise program on 08/03/20.

## 2020-08-05 ENCOUNTER — Ambulatory Visit: Payer: Self-pay

## 2020-08-16 ENCOUNTER — Other Ambulatory Visit: Payer: Self-pay

## 2020-08-16 ENCOUNTER — Ambulatory Visit (INDEPENDENT_AMBULATORY_CARE_PROVIDER_SITE_OTHER): Payer: Medicare PPO

## 2020-08-16 DIAGNOSIS — Z0000 Encounter for general adult medical examination without abnormal findings: Secondary | ICD-10-CM

## 2020-08-16 NOTE — Progress Notes (Signed)
Appointment Outcome:  Completed, Session #: 5   AGREEMENTS SECTION    Overall Goal(s): Lose 10-12 pounds by 08/24/20 Lower LDL by 09/23/20 Increase exercise to meet the recommended 150 minutes                               Agreement/Action Steps:  Schedule meals for work (create a eating schedule) Meal prep on weekends Portion control Increase exercise from 1 mile/day to reach recommended 150 minutes of exercise per week Gardening/Yard work Designer, television/film set light weights (sitting in chair)  Progress Notes:  Patient stated that they lost another 1.5 pounds over the course of 2 weeks. Patient has been battling serum sickness during this time that could have affected her weight lost due to chronic use of Prednisone. Patient stated that she feels better about her food choices. She realizes that she can make healthier food choices when she feels bad that is quick and easy to make. The patient has increased her protein intake. She is enjoying drinking various natural teas such as ginger (root) tea. Patient stated that she increased her water to avoid drinking soda and sweet tea. Patient stated that she is mindful of her carb intake and try to find foods that have higher protein than carbs. She also incorporates whole wheat products.  Patient stated that she eats her heavier meals during the earlier part of the day and have a light dinner. Patient is meal prepping and she feels like it is helping her stay disciplined and it is working. She is also incorporating her own garden vegetables in her diet. Patient has been given additional responsibilities at work and it sometimes interferes with her eating routine. However, the patient tries to ensure that she at least stick to having breakfast and lunch during the day. Patient is currently eating between 1200-1500 calories per day. She stated that she tries to burn 2000 calories per day to put her in a deficit of 500 calories per day, which equals 3500 calories  burned per week (1 pound).  Patient has been engaged in a walking program at her school on Tuesdays and Thursdays. Patient can walk a mile in approximately 12-15 minutes. Patient is averaging 2 miles in 30 minutes. Patient is getting approximately 2.5 miles in during the school day over the course of approximately 10 hours. Patient also landscapes her yard by weed eating and mowing which takes her approximately 4-5 hours each week. Patients stated that she is lifting light weights while she watches television.  Patient is concerned about tingling sensation that she is having mainly on her right side that spreads to her whole body. Patient states that it feels like sciatic nerve pain. She stated that she has a bulging disc and a pinched nerve in her neck and don't know what is causing it. Patient states that she has fibromyalgia as well. Patient states that she has to get up and move when she is in bed if this occurs during that time.      . Indicators of Success and Accountability:  Patient is waiting until 09/23/20 to determine if her LDL decreased. She is sticking to a healthier diet and exercise. Patient is on track to losing 10-12 pounds in 3 months. . Readiness: Patient is in the action stage of behavior change. . Strengths and Supports: Patient has friends and family as her central support system. Patient has been creative in incorporating various strategies to her exercise  routine. . Challenges and Barriers: Pain management, increased responsibilities at work, and stress  Coaching Outcomes: Patient is concerned about not losing weight in the mid-section. This was a teachable moment about stress and its relation to cortisol and fat burning. Patient realized that she is stressed out about the loss of a close friend recently and others that were close to her since the beginning of Covid. Patient discussed ways with Care Guide on how to incorporate stress management techniques into her daily life.  Patient revealed that one of her hobbies is solving puzzles. Patient also listened to calming music. Patient is now interested in utilizing these techniques during her wind down time between 7pm - 9pm. This is to help the patient to decompress, check in with self, and be present in the moment to help relieve stress/anxiety and improve sleep hygiene.  Patient will continue to incorporate all other strategies to obtain goals. Patient will take breaks to help manage pain from fibromyalgia, and to break up exercise into small increments of time so that does not overexert herself.  Attempted: Marland Kitchen Fulfilled - Patient completed the weekly agreement in full and was able to meet the challenge

## 2020-08-30 ENCOUNTER — Other Ambulatory Visit: Payer: Self-pay

## 2020-08-30 ENCOUNTER — Ambulatory Visit (INDEPENDENT_AMBULATORY_CARE_PROVIDER_SITE_OTHER): Payer: Medicare PPO

## 2020-08-30 DIAGNOSIS — Z0000 Encounter for general adult medical examination without abnormal findings: Secondary | ICD-10-CM

## 2020-08-30 NOTE — Progress Notes (Signed)
Appointment Outcome:  Completed, Session #: 6  AGREEMENTS SECTION    Overall Goal(s): Lose 10-12 pounds by 08/24/20 Lower LDL by 11/11 Maintain recommended 150 minutes of exercise per week Maintain healthy eating habits                                      Agreement/Action Steps:  Meal prepping Portion Control Label reading Scheduling meals Engage in running club at school Tuesday and Thursday Perform yard work/gardening Walking at [employer] work Designer, television/film set light [employer]   Progress Notes:  Patient took a trip to [place] and was able to continue her healthy eating and physical activity while with her family. Patient stated that she regularly walked the dog during the weekend. Patient mentioned that she walked a lot during this time at the [place], [place], and [employer]. Patient stated that she averaged about 4 miles per day.   Patient stated that she had balanced meals with lots of veggies such as humus and guacamole. Patient ate carbs during this trip to combat altitude sickness. These carbs were healthy such as pita and veggie chips. She did state that she had slight sweet tea as another carb. Patient states that she continues to minimize soda consumption and increased her water intake.  Prior to this trip the patient has been able to maintain her eating schedule by meal prepping. Patient stated that it is a wonderful way to avoid eating unhealthy foods because what she needs is prepared and it takes the guess work out of what to eat. Patient states that she continues to read food labels to ensure that she is not consuming too much cholesterol, unhealthy fats, and carbs. Patient has been able to maintain her eating schedule before and during work to ensure that she has eaten breakfast and lunch daily.   She continues to trim her yard with a weed eater, removing limbs, chopping wood, and recently planted fall plants before the trip. [employer] work typically takes her about 4 hours  each time due to the size of her yard. Patient stated that she has found lifting the light weights helpful. Patient is averaging 2 miles of walking daily during the school day.  Patient has been experiencing a lot of tingling in her legs and arms and is now taking PT. She has PT twice a week and do the PT exercises at home 2xs/day. Patient is currently using stretch bands to help with these exercises. Patient has been able to engage in the running club with the students at school. She is averaging 2 miles in 25 minutes at a high pace.    . Indicators of Success and Accountability:  On 11/11 patient is hoping to have reduced her LDL from the work that she has put in over the past three months. . Readiness: Patient is in maintenance stage of healthy eating and meeting 150 minutes of exercise weekly. . Strengths and Supports: Patient continues to have best friend and daughter as her major supports. Patient has been able to stick to her action steps to achieve her goal in spite of having two bouts with serum sickness.  . Challenges and Barriers: Patient is having issues with tingling in legs and arms that may interfere with physical movement. Stress could potentially be a barrier.  Coaching Outcomes: Patient has not been weighed recently to determine the additional amount of weight that the patient has lost. According  to the report of the patient, there has been about at 6-7 pound lost over 10 weeks. Patient will be weighed at next appt on 09/23/20.  Patient will continue to incorporate actions steps as outline to maintain healthy eating and her exercise routine to meet the recommended 150 minutes. Patient will continue to engage in PT for treatment of the tingling in arms and legs.   Patient has extended health coaching contract for another 6 sessions. Patient will start sessions on Sep 13, 2020, until Nov 22, 2020.    Patient stated that her greatest takeaway is making sure that she is incorporating  self-care into her routine to help combat stress from her job and other stressful events that are occurring.   Attempted: Marland Kitchen Fulfilled- Patient has been able to incorporate all of her action steps over the past two weeks to achieve her goals.

## 2020-09-08 ENCOUNTER — Telehealth: Payer: Self-pay | Admitting: Internal Medicine

## 2020-09-08 NOTE — Telephone Encounter (Signed)
Routed to Amy care guide

## 2020-09-08 NOTE — Telephone Encounter (Signed)
     Pt would like to r/s nurse visit. She said if she can r/s same day but 30 mins early like 4 pm. She said if that's not going to work any day of the week except Wednesday and around 4:30 to 5 pm. She also said to message her on mychart instead of calling her back

## 2020-09-09 NOTE — Telephone Encounter (Signed)
I have rescheduled the patient and sent a message to them via MyChart with the update.

## 2020-09-13 ENCOUNTER — Other Ambulatory Visit: Payer: Self-pay

## 2020-09-13 ENCOUNTER — Ambulatory Visit (INDEPENDENT_AMBULATORY_CARE_PROVIDER_SITE_OTHER): Payer: Medicare PPO

## 2020-09-13 ENCOUNTER — Ambulatory Visit: Payer: Medicare PPO

## 2020-09-13 DIAGNOSIS — Z0000 Encounter for general adult medical examination without abnormal findings: Secondary | ICD-10-CM

## 2020-09-13 NOTE — Progress Notes (Signed)
Appointment Outcome:  Completed, Session #: 7  AGREEMENTS SECTION   Overall Goal(s): Lose 10-12 lbs. Lower LDL by 11/11 Maintain recommended 150 minutes of exercise per week Maintain healthy eating habits                                               Agreement/Action Steps:  Physical therapy for tingling in arms and legs 2xs/wk Running club [employer] Meal prepping/portion control/label reading Schedule meals at work Perform yard [employer] after work/at work Biomedical scientist weights/stretch bands  Progress Notes:  Patient feels that they did not accomplish their goals during the second week due to schedule changes at work. During that week, the patient had extended duties at work on [employer] [place] that caused her not to be able to make the running club or walk after work. 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 She remained physically active (walking, climbing steps, lifting) during all hours at work. Patient was still able to complete her yard work and gardening over the weekend. The patient was able to maintain her exercise routine during week one. Patient has been attending physical therapy twice a week. She has incorporated stretches with bands and continues to use the light weights for arm exercises. Patient stated that these exercises help with the tingling in her arms and legs, but it is short-lived.  The patient worked 12 hour shifts during week two, and her schedule challenged her in maintaining her eating schedule, but she was able to manage to eat healthy at work. Patient has been practicing meal prepping. Recently, the patient prepared vegetable soup for a few days using vegetables from her garden. Patient typically eats chicken and fish as her main source of protein. Patient stated that she continues to read her food labels, watching for calories, sodium, unhealthy fats, sugars, protein, and fiber. She also maintains portion  control at each meal. Patient states that she consumes between 1400-1500 calories/day and burn about 2000 calories/day.  Patient states that she has not lost any additional weight but have not gained any over the last two weeks. Patient stated that overall, she feels good about herself and the progress that she has made.    . Indicators of Success and Accountability:  Get a report on 11/11 that her LDL cholesterol has improved from the changes she has made in her diet and exercise routine. . Readiness: Patient is in the maintenance phase of behavior change with healthy eating and exercise habits. . Strengths and Supports: Patient continues to have her daughter and best friend as her supports. Patient takes the initiative to find creative ways to deal with challenges that arise. . Challenges and Barriers: The patient's work schedule poses the biggest challenge.    Coaching Outcomes: Patient will maintain her physical activity and healthy eating strategies over the next two weeks as outlined. Patient will continue to engage in physical therapy twice a week and perform the prescribed exercises daily. Due to the tingling that she is experiencing, patient is interested in low impact exercises that will aid in keeping her active during this time.  Patient is considering reducing her caloric intake to 1200-1300 calories a day with a 2000 calorie expenditure. Patient will consume majority of her calories during the day and eat a light  dinner. Patient is aware that she should speak to her PCP before making these changes.   Patient perspective changed in viewing her second week as a failure and recognizing the work that she did put in toward physical activity while she was at work during the week. Patient recognizes that she has been creative in finding ways to counter the challenges she faced in implementing her techniques. Patient understands that her physical activity may varying from each week and that  she may implement new techniques or make accommodations to make physical activity fit within her schedule. Patient left optimistic.   Attempted: Marland Kitchen Fulfilled- Patient was able to maintain the recommended 150 minutes of exercise over the past two weeks. Patient was able to maintain her healthy eating habits and eating schedule. Patient have attended her physical therapy sessions as scheduled and performed the prescribed exercises daily. . Not met- Patient was not able to attend the running club on [employer] or walk after work during week two.

## 2020-09-21 LAB — LIPID PANEL
Chol/HDL Ratio: 3.9 ratio (ref 0.0–4.4)
Cholesterol, Total: 248 mg/dL — ABNORMAL HIGH (ref 100–199)
HDL: 63 mg/dL (ref >39)
LDL Chol Calc (NIH): 162 mg/dL — ABNORMAL HIGH (ref 0–99)
Triglycerides: 129 mg/dL (ref 0–149)
VLDL Cholesterol Cal: 23 mg/dL (ref 5–40)

## 2020-09-23 ENCOUNTER — Ambulatory Visit: Payer: Medicare PPO | Admitting: Internal Medicine

## 2020-09-23 ENCOUNTER — Encounter: Payer: Self-pay | Admitting: Internal Medicine

## 2020-09-23 VITALS — BP 112/78 | HR 65 | Ht 68.0 in | Wt 141.6 lb

## 2020-09-23 DIAGNOSIS — Z789 Other specified health status: Secondary | ICD-10-CM | POA: Diagnosis not present

## 2020-09-23 DIAGNOSIS — E7849 Other hyperlipidemia: Secondary | ICD-10-CM

## 2020-09-23 MED ORDER — EZETIMIBE 10 MG PO TABS: 10.0000 mg | ORAL_TABLET | Freq: Every day | ORAL | 3 refills | Status: DC

## 2020-09-23 NOTE — Patient Instructions (Signed)
Medication Instructions:  Your physician has recommended you make the following change in your medication:  START zetia 10mg  daily  *If you need a refill on your cardiac medications before your next appointment, please call your pharmacy*   Lab Work: FASTING lipid panel in 3 months  If you have labs (blood work) drawn today and your tests are completely normal, you will receive your results only by: Marland Kitchen MyChart Message (if you have MyChart) OR . A paper copy in the mail If you have any lab test that is abnormal or we need to change your treatment, we will call you to review the results.   Testing/Procedures: NONE   Follow-Up: At [facility], you and your health needs are our priority.  As part of our continuing mission to provide you with exceptional heart care, we have created designated Provider Care Teams.  These Care Teams include your primary Cardiologist (physician) and Advanced Practice Providers (APPs -  Physician Assistants and Nurse Practitioners) who all work together to provide you with the care you need, when you need it.  We recommend signing up for the patient portal called "MyChart".  Sign up information is provided on this After Visit Summary.  MyChart is used to connect with patients for Virtual Visits (Telemedicine).  Patients are able to view lab/test results, encounter notes, upcoming appointments, etc.  Non-urgent messages can be sent to your provider as well.   To learn more about what you can do with MyChart, go to [email or web address].    Your next appointment:   3-4 month(s)  The format for your next appointment:   In Person  Provider:   K. Mali [name], MD   Other Instructions

## 2020-09-23 NOTE — Progress Notes (Signed)
LIPID CLINIC CONSULT NOTE  Chief Complaint:  Manage dyslipidemia  Primary Care Physician: Patient, No Pcp Per  Primary Cardiologist:  No primary care provider on file.  HPI:  Rebecca Montgomery is a 64 y.o. female who is being seen today for the evaluation of dyslipidemia at the request of No ref. provider found.  This is a pleasant 64 year old female with longstanding history of dyslipidemia probably with the notable elevation in her cholesterol since she was told in her 33s.  She has a strong family history of heart disease and stroke including 2 brothers who had strokes in their 2s and 68s.  There is also family history of high triglycerides although her triglycerides now are better controlled but has been as high as 294 in the past.  Findings are highly concerning for familial hyperlipidemia or possibly familial combined hyperlipidemia.  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  She has had some worsening memory difficulty/memory fog on statins in the past and has been tried on several including atorvastatin, rosuvastatin and others.  She is averse to taking a statin medication.  Her most recent lipid showed total cholesterol 310, triglycerides 151, HDL 56 and LDL of 223.  She reports a very heart healthy diet in fact nearly vegan diet.  Her weight generally is around 120 pounds however recently she had a MRSA infection as well as an anaphylactic reaction to the modality vaccine which took several weeks to recover from.  She also has a history of anaphylaxis and serum sickness and allergies to shellfish and bee venom as well as fire ants.  09/23/2020  Rebecca Montgomery returns today for follow-up.  Her lipids are improved despite not being on therapy.  She is made dietary changes, more physical activity and other things and has come off of some steroid medications that she had been taking more regularly.  All of this may have contributed to improvement in her lipids with the most  recent total cholesterol 248, triglycerides 129, HDL 63 and LDL 162.  Overall this is significant improvement however I suspect cannot be much lower as she is maximized her lifestyle modifications.  Because of her family history of stroke I would try to target LDL lower ideally at 100 if we can achieve that.  We discussed several other options for possible therapies.  PMHx:  Past Medical History:  Diagnosis Date  . Acid reflux   . Back pain   . Basal cell adenocarcinoma   . Concussion 12/2000  . Headache   . Hypercholesteremia   . Neck pain   . Osteoporosis     Past Surgical History:  Procedure Laterality Date  . ABDOMINAL HYSTERECTOMY    . EYE SURGERY Right   . HERNIA REPAIR    . KNEE SURGERY Right   . WRIST SURGERY Right     FAMHx:  Family History  Problem Relation Age of Onset  . Dementia Mother        heavy smoker  . Heart failure Mother   . Congestive Heart Failure Father   . Diabetes Sister   . Hyperlipidemia Sister   . Stroke Brother   . Hyperlipidemia Brother   . Diabetes Sister   . Hyperlipidemia Sister   . Stroke Brother   . Hyperlipidemia Brother   . Heart defect Daughter     SOCHx:   reports that she has never smoked. She has never used smokeless tobacco. She reports that she does not drink alcohol  and does not use drugs.  ALLERGIES:  Allergies  Allergen Reactions  . Bee Venom Other (See Comments)    Serum sickness reported with insect bite. Has been treated with prednisone only in the past per patient.  Also, fire ants.  Anaphylaxis reaction.  . Covid-19 (Mrna) Vaccine Anaphylaxis  . Lidocaine Anaphylaxis    ALL CAINES PER PATIENT.  Marland Kitchen Shellfish Allergy Anaphylaxis    ROS: Pertinent items noted in HPI and remainder of comprehensive ROS otherwise negative.  HOME MEDS: Current Outpatient Medications on File Prior to Visit  Medication Sig Dispense Refill  . acyclovir (ZOVIRAX) 400 MG tablet Take 1 tablet by mouth as needed.    Marland Kitchen aspirin 81 MG  tablet Take 81 mg by mouth daily.    Marland Kitchen EPINEPHrine 0.3 mg/0.3 mL IJ SOAJ injection INJECT CONTENTS OF 1 PEN INTO OUTER THIGH FOR SEVERE ALLERGIC REACTION. CALL 911 AFTER USE.  1  . predniSONE (DELTASONE) 50 MG tablet Take 50 mg by mouth as needed.      No current facility-administered medications on file prior to visit.    LABS/IMAGING: No results found for this or any previous visit (from the past 48 hour(s)). No results found.  LIPID PANEL:    Component Value Date/Time   CHOL 248 (H) 09/21/2020 0808   TRIG 129 09/21/2020 0808   HDL 63 09/21/2020 0808   CHOLHDL 3.9 09/21/2020 0808   LDLCALC 162 (H) 09/21/2020 0808    WEIGHTS: Wt Readings from Last 3 Encounters:  09/23/20 141 lb 9.6 oz (64.2 kg)  05/18/20 141 lb (64 kg)  04/05/20 142 lb (64.4 kg)    VITALS: BP 112/78   Pulse 65   Ht 5\' 8"  (1.727 m)   Wt 141 lb 9.6 oz (64.2 kg)   SpO2 99%   BMI 21.53 kg/m   EXAM: Deferred  EKG: Deferred  ASSESSMENT: 1. 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 Statin intolerance-memory loss 3. Family history of premature stroke  PLAN: 1.   Rebecca Montgomery has had significant reduction in her lipids without therapies, suggesting primarily there may been a strong dietary component as well as association with steroid medication she was taking for a while.  At this point LDL is about the best that she seen in the past at 162.  Because of her family history I would recommend to see if we could get it a little lower.  She cannot take statins or the PCSK9 inhibitors.  We will try low-dose ezetimibe 10 mg daily.  Repeat lipids in 3 to 4 months and follow up with me at that time.  Rebecca Casino, MD, [facility], [place] Director of the Advanced Lipid Disorders &  Cardiovascular Risk Reduction Clinic Diplomate of the American Board of Clinical Lipidology Attending Cardiologist  Direct Dial: [phone]  Fax: [phone]  Website:   [name] 09/23/2020, 10:24 AM

## 2020-09-24 ENCOUNTER — Encounter: Payer: Self-pay | Admitting: *Deleted

## 2020-09-27 ENCOUNTER — Ambulatory Visit (INDEPENDENT_AMBULATORY_CARE_PROVIDER_SITE_OTHER): Payer: Medicare PPO

## 2020-09-27 DIAGNOSIS — Z0000 Encounter for general adult medical examination without abnormal findings: Secondary | ICD-10-CM

## 2020-09-27 NOTE — Progress Notes (Signed)
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 Patient is happy about the results she achieved through changing her diet and exercise behaviors. Patient is currently taking Zetia for cholesterol as a stroke preventive measure. Patient stated that the only side effect she has experienced is increased joint pain/stiffness. At this time, the patient states that the pain is felt mainly in her right leg and comes and goes in other places. Patient stated that she wants to deal with this problem by moving around more.  Patient has been able to maintain her exercise routine that includes the yard work/gardening on the weekends. Patient stated that she has added more time onto the yard work due to having to blow leaves. Patient is currently clearing her garden for the winter. 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 They are preparing for their running event on Dec 11th with the kids where she will continue to walk at a fast pace. Patient has also incorporated low impact cardio workouts she is doing inside her home and a routine basis in addition to using the stretch bands and lifting light weights.   Patient stated that she eats most of her calories during the day when she is the most active and eats a light, balanced meal at dinner. Patient  stated that she has witnessed less bloat and tightness in her abdominal area since making this change. Patient continues to read labels, practice portion control, and meal prep for the week. Patient has been able to maintain her meal schedule at work as well.   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 Patient states this will help her maintain her eating habits at the social event for [employer].  Patient stated that she weighed in at 141 with her clothes on the other day and after eating breakfast. However, patient states that she only weighs about 135 first thing in the morning before dressing or eating. Patient is okay with the 6-pound loss but aims to loss more.    . Indicators of Success and Accountability:  Patient wants to reduce LDL to approximately 100 in order to stop taking Zetia. . Readiness: Patient is in maintenance phase of reducing LDL, increasing exercise, and healthy eating habits.  . Strengths and Supports: Patient stated that she has her trainer for hiking, daughter, and best friend. Patient has also been dedicated to implementing action steps over the last four months and this is a strength she will be able to carry over into the following months to improve LDL and weight loss further. Marland Kitchen  Challenges and Barriers: Patient only barrier is serum sickness. She has been able to master all other barriers she's encountered.  Coaching Outcomes: Overall, patient stated that she feels better, has less acid reflux, and is getting good results from the steps she has completed. Patient is currently planning on completing a 28-mile hike in March 2022 in [place], [place]. Patient will be training 2x/wk for this event starting now. The patient aims to work towards completing 3 miles a day during training for now. She is mapping out an area for training that is free from fire ants.   Patient plans  to purchase exercise equipment at the beginning of the year to allow for in home workouts. Patient feels that when she weighed less, she hurt less, and aims to lose 5 additional pounds.   Patient is scheduled to have lab work done again in March 2022 to check her cholesterol. Patient informed Care Guide that Physical Therapy will be ending soon, but she will continue to incorporate the stretching exercises they recommended for treatment.   Patient will continue to implement all action steps to maintain 150 minutes of physical activity and healthy eating habits to work towards reducing LDL further.   Attempted: Marland Kitchen Fulfilled #- Patient was able to maintain all physical activity and healthy eating behaviors over the course of two weeks. Patient has met the goal of reducing LDL. Patient is happy with the 6-pound lost during this timeframe.

## 2020-10-06 ENCOUNTER — Encounter: Payer: Self-pay | Admitting: Neurology

## 2020-10-06 ENCOUNTER — Ambulatory Visit: Payer: Medicare PPO | Admitting: Neurology

## 2020-10-06 ENCOUNTER — Telehealth: Payer: Self-pay | Admitting: Neurology

## 2020-10-06 ENCOUNTER — Other Ambulatory Visit: Payer: Self-pay

## 2020-10-06 VITALS — BP 116/68 | HR 69 | Ht 68.0 in | Wt 144.5 lb

## 2020-10-06 DIAGNOSIS — R202 Paresthesia of skin: Secondary | ICD-10-CM

## 2020-10-06 DIAGNOSIS — M4302 Spondylolysis, cervical region: Secondary | ICD-10-CM

## 2020-10-06 HISTORY — DX: Spondylolysis, cervical region: M43.02

## 2020-10-06 HISTORY — DX: Paresthesia of skin: R20.2

## 2020-10-06 NOTE — Telephone Encounter (Signed)
Rebecca Montgomery Rebecca Montgomery: [id number] (exp. 10/06/20 to 11/05/20) order sent to GI. They will reach out to the patient to schedule.

## 2020-10-06 NOTE — Telephone Encounter (Signed)
[employer] pending  
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
## 2020-10-11 ENCOUNTER — Ambulatory Visit (INDEPENDENT_AMBULATORY_CARE_PROVIDER_SITE_OTHER): Payer: Medicare PPO

## 2020-10-11 ENCOUNTER — Other Ambulatory Visit: Payer: Self-pay

## 2020-10-11 DIAGNOSIS — Z0000 Encounter for general adult medical examination without abnormal findings: Secondary | ICD-10-CM

## 2020-10-11 NOTE — Progress Notes (Signed)
Appointment Outcome:  Completed, Session #: 9  AGREEMENTS SECTION    Overall Goal(s): Lose 5 pounds in 3 months Lower LDL from 162 to <100 by March 2nd Maintain 150 minutes of weekly exercise Maintain healthy eating habits                                                Agreement/Action Steps:  Attend physical therapy 2x/wk Participate in [place] reading Meal schedule for work [employer] during and after work  Progress Notes:  Patient stated that she cooked a healthier version of Thanksgiving food for a school for an event. She incorporated her action steps to maintain her healthy eating habits. Patient stated that planning meals is rewarding.  Patient stated that on her trip she had dinner by 5pm and only had water thereafter. Patient mentioned that she was not in charge of her food when she was visiting her family. Patient stated that she did have a small snack that she considered unhealthy.   Patient stated that she recognizes that she is a stress eater and have built that into her eating habits. Patient prepares for stress eating by replacing what she eats when she is stressed. Patient stated that she eats between 1200-1300 calories daily. She ate mostly during the day when she was most active and saved about 300 calories for dinner. This has helped with acid reflux. Patient stated that she has eaten switched to [place] sausage, lots of vegetables, but not a lot of meat, and she cooks with olive oil. She is ensuring that she is eating healthy fats.   Patient stated that while she was on her trip, she exercised 1-1.5 hours to decompress. She also walked in the park with her grandchildren. They also took the dog for walks daily. Patient stated that she has increased her walking by 3 days a week. This includes walking during work and constantly moving. Patient ensured that she seized the moment to be physically  active. She walked during the time that she was at the airport constantly for an 1hr. Patient used weight while watching tv. This helped divert her attention from eating too much. Patient has been exercising in home with YouTube videos shared by [place]. Patient has using stretch bands as instructed by physical therapy. She also has attended physical therapy appointments as scheduled.  Patient stated that during her recent appointment she was told that her lower back is potentially the main cause for the tingling in her right leg. She has also experienced an increase in pain from the medication that she is taking for her cholesterol. She mentioned that she is stiffer after laying down or sitting longer. She was encouraged by her provider to continue exercising to manage her pain.   To manage her stress, the patient has set aside 1-1.5 hrs. to decompress before bedtime. During this time, she read, watch tv, and play Sudoku. This allows her not to thinking about things and she calls this her brainless time.    . Indicators of Success and Accountability:  Patient stated that her markers of success are how she feels not just physically and that she is seeing the benefit of implementing the steps. . Readiness: Patient is in maintenance phase of exercise and healthy eating habits. Patient is in action phase  of losing weight and lowering LDL.  . Strengths and Supports: Patient stated that her faith, willpower, and mindfulness are her strengths. Patient supports are her daughter and best friend. . Challenges and Barriers: Patient is concerned about eating at social gatherings such as Christmas. Patient stated that portion control will also be another challenge. Patient is also experiencing tingling in her leg that may cause discomfort when exercising. .  Coaching Outcomes:  Patient stated that she is going to meal prep vegetable soup with beans and meatless chili to get back on track with her healthy  eating habits after eating. Patient stated that she can make the healthiest choices possible when in social gatherings.   Patient plans to work on stress management by engaging in Christmas activities such as decorating the school, watching Christmas movies, and reading books. Patient recognizes that exercise helps with stress management. She also loves to be out in nature, which encourage her to do more activities outdoors. The patient states that giving back to the children at school helps with decreasing her stress.  Patient will be participating in the [place] on Dec 11th. After this, patient will be able to walk longer after walk and plan to walk 3-4 miles within 1hr. Patient has scheduled training for the hike. Patient plans to walk twice a week with the trainer on a trail.  Patient will work on a different routine for her yard work and gardening. She stated that she has some winter projects. She will continue to manicure the yard and plans to trim a tree.   Patient will purchase an elliptical in Jan.   Patient's biggest takeaways from today's session are: She does not have any alternatives to improving her healthy behaviors because she needs to make her health/self a priority. Her perspective now is this is her new way of life. Patient also stated that she must be mindful and intentional to maintain her healthy behaviors.  Note: Patient currently weighs 144.8 lbs. She is aiming to lose 20 pounds.  Attempted: Marland Kitchen Fulfilled #- Patient was able to maintain her weekly exercise goal by implementing a variety of physical activity outline in the action steps and those that were easy to engage in during her trip. . Partial #- Patient was able to maintain her healthy eating habits until Thanksgiving.   Not Attempted: Marland Kitchen Deferred #- Patient has not started training for the 28-mile hike in March. Patient has scheduled the training for twice a week with a friend.

## 2020-10-22 ENCOUNTER — Ambulatory Visit
Admission: RE | Admit: 2020-10-22 | Discharge: 2020-10-22 | Disposition: A | Discharge status: Home / Self Care | Payer: Medicare PPO | Source: Ambulatory Visit | Attending: Neurology | Admitting: Neurology

## 2020-10-22 ENCOUNTER — Telehealth: Payer: Self-pay

## 2020-10-22 ENCOUNTER — Other Ambulatory Visit: Payer: Self-pay

## 2020-10-22 DIAGNOSIS — R202 Paresthesia of skin: Secondary | ICD-10-CM

## 2020-10-22 DIAGNOSIS — Z0000 Encounter for general adult medical examination without abnormal findings: Secondary | ICD-10-CM

## 2020-10-22 DIAGNOSIS — M4302 Spondylolysis, cervical region: Secondary | ICD-10-CM | POA: Diagnosis not present

## 2020-10-22 NOTE — Telephone Encounter (Signed)
Patient called to reschedule health coaching session on 10/25/20 from 4:30pm to 5pm. Patient's appointment has been rescheduled as requested.

## 2020-10-25 ENCOUNTER — Other Ambulatory Visit: Payer: Self-pay

## 2020-10-25 ENCOUNTER — Ambulatory Visit (INDEPENDENT_AMBULATORY_CARE_PROVIDER_SITE_OTHER): Payer: Medicare PPO

## 2020-10-25 ENCOUNTER — Ambulatory Visit: Payer: Medicare PPO

## 2020-10-25 ENCOUNTER — Telehealth: Payer: Self-pay | Admitting: Neurology

## 2020-10-25 DIAGNOSIS — Z0000 Encounter for general adult medical examination without abnormal findings: Secondary | ICD-10-CM

## 2020-10-25 NOTE — Telephone Encounter (Signed)
Left patient a detailed message, with results, on voicemail (ok per DPR).  Provided our number to call back with any questions.  

## 2020-10-25 NOTE — Telephone Encounter (Signed)
   IMPRESSION: This MRI of the cervical spine without contrast shows the following: 1.   The spinal cord appears normal. 2.   At C5-C6, there is mild spinal stenosis and moderately severe bilateral foraminal narrowing.  There is potential for C6 nerve root compression to either side. 3.   At C6-C7, there is mild spinal stenosis but no significant foraminal narrowing and no nerve root compression 4.   Minimal degenerative changes at C3-C4 and C4-C5 do not lead to spinal stenosis or nerve root compression.   Please call patient, MRI of cervical spine showed multilevel degenerative changes, there was no evidence of spinal cord or nerve root compression.

## 2020-10-25 NOTE — Progress Notes (Signed)
Appointment Outcome:  Completed, Session #: 10  AGREEMENTS SECTION    Overall Goal(s): No weight gain Lower LDL from 162 to <100 by January 12, 2021 Maintain 150 minutes of weekly exercise Maintain healthy eating habits Complete 28-mile hike in March 2022                                         Agreement/Action Steps:  Participate in [place]  Meal prep/portion control/label reading [employer] during and after work (1 hr.) Lifting weights/use stretch bands from [employer] 2x/wk for hiking  Progress Notes:  Patient is maintaining her goal of 150 minutes of weekly exercise. Patient has completed physical therapy but continues to do the exercises with the stretch band. Patient stated that she is walking 3 miles during the day. She walks 2.5 hrs. during work and 1/2 hrs. after work.   Patient stated that she did yard work on Saturday that included climbing. She is winterizing her garden as well. Patient also mentioned that she spent a weekend cleaning the entire house. Patient participated the [place]. She walked 3 miles in 45 minutes. On this day, she walked a total of 6 miles. Patient stated that she has started training twice a week for the 28-mile hike. Patient is following videos at home to help with training as well.   Patient recognized that after all her exercising in the evenings she has a lot of energy and have trouble falling asleep.   Patient stated that she practices mindfulness of what she was eating during an event at her work place. Patient practiced portion control and making healthy choices like nuts verses cake. Patient is always weighing her choices against her action steps to help her choose vegetables and fruit rather than foods with unhealthy fats and high in calories.      . Indicators of Success and Accountability:  Patient stated that her indicators of success and accountability are that she can maintain portion control, not gaining  weight, monitoring calories and types of fats, and monitoring exercise.  . Readiness: Patient is in the maintenance phase of 150 minutes of weekly exercise and healthy eating behaviors.   . Strengths and Supports: Patient has her children to support her, especially her daughter. Patient identified having a mindset change to where she believes that she is in a better place, and this can be successful.  . Challenges and Barriers: Patient stated that pain management and altitude sickness may be barriers to maintaining exercise and healthy eating habits.   Coaching Outcomes: Patient is going on vacation for Christmas and decided to put a plan in place to reduce the chances of not maintaining her goals. Some of the action steps will not be applicable, such as meal prepping, because she is not in charge of cooking. To continue awareness of what she is eating and stay as active as possible, the patient plans to:  [place] good fats Practice portion control Eat a balanced meal Don't snack before dinner Drink plenty of water in place of tea and soda while waiting for dinner  Maintain Weekly Exercise Walk the dog in morning for 1 mile Take grandchildren to the park to walk Walk around [place] Physical play with grandchildren  Brainless Time Before Bed Read a book before bed  Patient will continue to train for the 28-mile hike  and other action steps after she returns from her Christmas break.   Patient's takeaway from today's session is that she is mindful of her behaviors, which helps her stay on track with goals.     Attempted: Marland Kitchen Fulfilled - Patient is maintaining weekly exercise and healthy eating behavior goals working towards not gaining weight and lowering LDL to <100. Patient was able to train twice a week for 28-mile hike. Patient completed physical therapy.    Not attempted: Marland Kitchen Deferred - Due to the patient's vacation, she will resume training for the  28-mile hike when she returns in Jan. 2022.

## 2020-11-22 ENCOUNTER — Other Ambulatory Visit: Payer: Self-pay

## 2020-11-22 ENCOUNTER — Ambulatory Visit (INDEPENDENT_AMBULATORY_CARE_PROVIDER_SITE_OTHER): Payer: Medicare PPO

## 2020-11-22 DIAGNOSIS — Z0000 Encounter for general adult medical examination without abnormal findings: Secondary | ICD-10-CM

## 2020-11-22 NOTE — Progress Notes (Signed)
Appointment Outcome:  Completed, Session #: 11  AGREEMENTS SECTION   Overall Goal(s): Lower LDL from 162 to <100 (March 2nd) Take Zetia as prescribed Maintain 150 minutes of weekly exercise Maintain healthy eating habits Train for 28-mile hike in March 2022                                            Agreement/Action Steps:  Meal prep/portion control/label reading [employer] during/after work (1hr) Lift weights/use stretch bands Train 2x/wk for hiking  Progress Notes:  Patient schedule during the holidays was interrupted by traveling and the passing of a family member. Patient implemented her action steps during the first two weeks as agreed upon prior to these circumstances. Patient managed to find ways to incorporate physical activity on her trip away from home with being active with grandchildren and walking the dog. Patient was able to practice portion control and restraint from eating foods that were high in fats and a lot of unhealthy carbs. Patient has been able take her medication as prescribed. Patient stated that she managed not to gain weight during this time. Now that the patient is back home, she stated that she can refocus on eating the way that she is used to. She did mention that she has been eating foods that people have brought over as a condolence.   Patient mentioned that she went on a 3-hour hike as a coping strategy for grieving the loss of her brother. Patient stated that being in nature is a way that she recharges and free her mind. Patient plans to continue engaging in walks and yard work/gardening as ways to cope. Patient was able to do yard work yesterday to remove debris from yard. Patient stated that upon returning to work after the break, she is back to walking 3 miles a day at work and gets her heart rate up.  Patient has been incorporating rest during this time to help her manage stress. Patient also has scheduled quiet time where she reads,  watch movies, or work on [employer]. Patient stated that she just returned to gratitude journaling as a way to cope as well by writing about three things that she is thankful for daily. Patient believes this is helping her with the grieving process.   . Indicators of Success and Accountability:  Patient has managed not to gain weight and is pleased with this outcome. . Readiness: Patient is in the action phase of implementing her action steps.  . Strengths and Supports: Patient has her daughter and best friends as supporters. Patient is optimistic about achieving her goals and is motivated to implement action steps to do so. . Challenges and Barriers: Grief  Coaching Outcomes: Patient will continue to journal as a coping/stress management technique. Patient will return to normal routine of exercising and eating healthy (meal prepping, reading food labels, practicing portion control).  Patient will continue to incorporate quiet time and activities to manage stress. Patient stated that she will prioritize the things that she has to do and will perform majority of her physical activity during the morning because this is when her energy is the highest and get rest in during evenings for now.   Patient will return to training for hike as agreed upon in last session no 10/25/20 this month.   Attempted: Marland Kitchen Fulfilled #- Patient is taking medication as prescribed for  cholesterol.  . Partial #- Patient was able to engage in her action steps for most of the time over the past month. Although training was deferred until Jan 2022, patient did hike for coping reasons/leisure last week.

## 2020-12-06 ENCOUNTER — Other Ambulatory Visit: Payer: Self-pay

## 2020-12-06 ENCOUNTER — Ambulatory Visit (INDEPENDENT_AMBULATORY_CARE_PROVIDER_SITE_OTHER): Payer: Medicare PPO

## 2020-12-06 DIAGNOSIS — Z0000 Encounter for general adult medical examination without abnormal findings: Secondary | ICD-10-CM

## 2020-12-06 NOTE — Progress Notes (Signed)
Appointment Outcome:  Completed, Session #: 12  AGREEMENTS SECTION   Overall Goal(s): Stress management Lower LDL from 162 <100 (March 2nd) Maintain 150 minutes of weekly exercise Maintain healthy eating habits Train for 28-mile hike in March 2022                                              Agreement/Action Steps:  Meal prep/portion control/reading food labels Take Zetia as prescribed Walking during/after work (1 hour) Product/process development scientist bands Train 2x/wk for hike Journal daily Quiet time with activities of choice  Progress Notes:  Patient stated that journaling has really helped keep her mind together. Patient uses her journal for several reasons: to identify her next steps, to reflect on the times she shared with her late brother, and to write down three things she is grateful for. Patient stated that she also sits in her sunroom to reflect and check in with herself. Patient stated that she also writes during this time to figure out where she is mentally and emotionally, how she is feeling about things, and what she can do about things she is dealing with. Patient has recently identified how she can balance family relationships during Covid without putting herself and others at risk, which has been a major concern of hers.  Patient stated that she has prioritized self-care to manage stress. This includes taking breaks at home when she is tired. She also engages in watching movies, completing puzzles, and reading books during her quiet time.  Patient stated that she does most of her exercise/physical activity during the day at work. Patient reported that in place of lifting weights at home, she has been climbing ladders and been lifting heavy items at work putting things on selves. She is currently walking 2-3 miles daily. She has also engaged in intense housework that has included cleaning closets, reorganizing rooms in her home. Patient has not been able to do any yard work due to the  weather. Patient stated that she would be more active by going to the gym if Covid wasn't an issue. Patient mentioned that exercising has helped her manage her stress as well. Patient was not able to purchase her exercise bike this month due to various unexpected expenses since Dec. 21. Patient plans to purchase this equipment in Feb. 22. Patient has not been able to train for hike because of weather.  Patient has been able to maintain her healthy eating habits now that the holidays are over, and she is back in control of what she eats. Patient stated that she is now attempting to have salmon 1-2 times a week. Patient continues to meal prep, practice portion control and read food labels.   Patient is taking Zetia as prescribed. Her only compliant is that it causes her physical pain. Patient stated this is something she must live with any way because of Fibromyalgia.   . Indicators of Success and Accountability:  Patient has been able to find various ways to implement action steps despite challenges she has had over the past few months.  . Readiness: Patient is in maintenance phase of exercising for 150 minutes weekly. Patient is in action phase of lowering LDL. Patient in back in action phase of implementing healthy eating habits. Patient is in action phase of managing stress. Patient is in preparation phase of training for hike. . Strengths and Supports:  Patient has daughter and best friend as supports. Patient has been innovative/creative and adaptive in making and revising action steps to fit various stages of her life to maintain her goals.  . Challenges and Barriers: Grief is a challenge the patient may have to implementing action steps.   Coaching Outcomes: Patient will continue to incorporate rest breaks to aid in managing stress. Patient will continue to prioritize self-care. Patient will continue to perform self-check-ins while sitting in sunroom.   Patient will continue to engage in majority  of her physical activity/exercise during the day so that she can focus on self-care more so at home. Patient will incorporate housework to maintain 150- minutes of exercise weekly. Patient will return to yard work when the weather permits.   Patient will maintain all other action steps as agreed upon.   Patient has extended her health coaching agreement for another six sessions to work on lowering LDL and stress management.  Attempted: Marland Kitchen Fulfilled #- Patient is journaling, meal prepping/portion control/label reading, taking Zetia, and lifting heavy items. . Partial #- Patient is mainly walking during the day at work. Not met #- Patient has not been able to perform yard work. Patient has not trained for hike.   No Attempted: Marland Kitchen Deferred #- Patient will return to yard work and training for hike when the weather permits.  Marland Kitchen

## 2020-12-20 ENCOUNTER — Ambulatory Visit (INDEPENDENT_AMBULATORY_CARE_PROVIDER_SITE_OTHER): Payer: Medicare PPO

## 2020-12-20 ENCOUNTER — Other Ambulatory Visit: Payer: Self-pay

## 2020-12-20 DIAGNOSIS — Z0000 Encounter for general adult medical examination without abnormal findings: Secondary | ICD-10-CM

## 2020-12-20 NOTE — Progress Notes (Signed)
Appointment Outcome:  Completed, Session #: 13  AGREEMENTS SECTION   Overall Goal(s): Stress management Lower LDL Maintain 150 minutes of weekly exercise Maintain healthy eating habits                                            Agreement/Action Steps:  Stress management Journal daily Quiet time with activities  Lower LDL Take Zetia as prescribed Meal prep/food journal portion control label reading  Exercise Home exercise videos Walking 2-3 miles/day Lifting weights/using stretch bands Train 2x/wk for hike in March 2022  Progress Notes:  Patient stated that she has busy evenings now and work until 6 on Tues and Wed, while on other days she plays catch up at work. Due to her change in schedule, she is tired in the evenings, therefore, she does most of her physical activity during the day. Patient mentioned that in the mornings she takes the longest routes to get in the most steps and walk fast to get her heart rate up. The patient stated that she still walks 2-3 miles per day during each workday M-F.   Patient stated that she has started the home exercise videos for 30 mins 2-3 times per week in addition to walking. Patient is also lifting weights and using the stretch bands as recommended from PT. Patient stated that she sits in a rocking chair so that she is constantly moving. Patient mentioned that she is not getting in enough miles on the weekend for training for the hike in March because of the weather. She stated that this is something that she needs to work towards.   Patient stated that she has been emotionally wiped out with the recent passing of a dear friend. Patient stated that to celebrate her friend's life, a virtual meeting was held, and it was a helpful gathering. Patient stated that writing in her journal helps her focus and relieve stress. She uses the journal to focus on next steps instead of staying in the current mindset that she is in when she starts writing.  Patient stated this helps her keep from carrying things to work with her. Patient stated that she must engage in her quiet time every day at home. She stated that this is part of her grieving time. She reported that she gives herself some time to process her grief and then move on to activities such as reading books, completing puzzles, or watching a tv that helps put her in a positive mindset.   Patient reported that she is taking Zetia as prescribed. She stated that she is keeping a food journal and meal prepping on the weekends but feel that she would like to adjust the things that she is prepping. Patient is thinking about going more vegan so that she can eat less meat. Patient suggested that she would prep a vegan chili, foods that are fat free, or that freeze better. Patient stated that she must work on not going for convenient or comfort food when she is tired or stressed although she has prepped food. Despite this challenge, the patient is still practicing portion control and reading food label.    . Indicators of Success and Accountability:  Patient stated that getting back to her diet and purchasing healthy foods when out are indicators of her success.  . Readiness: Patient is in the maintenance phase of 150 minutes of exercise  and stress management. Patient is in action phase of incorporating healthy eating behaviors to lower LDL.  . Strengths and Supports: Patient has a host of family and friends as supporters. Patient stated that perseverance, focus, and raised awareness are her strengths. . Challenges and Barriers: Patient does not foresee any challenges/barriers to implementing steps.   Coaching Outcomes: Patient will continue to implement action steps as agreed upon.   Patient will begin incorporating more vegan options into her diet to reduce animal products/fat. Patient will avoid eating out and eat the foods that she has prepped to reduce convenient/comfort eating.  Patient will  continue to attempt to train for hike on weekends by gradually increasing mileage covered.   Patient has a new goal of losing 5 pounds by January 12, 2021.   Attempted: Marland Kitchen Fulfilled #- Patient is implementing all steps for stress management, lowering LDL, and is walking 2-3 miles/day, following home exercise videos, and lifting weights/using stretch bands. . Partial #- Patient trained some for the hike in March.

## 2021-01-03 ENCOUNTER — Ambulatory Visit (INDEPENDENT_AMBULATORY_CARE_PROVIDER_SITE_OTHER): Payer: Medicare PPO

## 2021-01-03 ENCOUNTER — Other Ambulatory Visit: Payer: Self-pay

## 2021-01-03 DIAGNOSIS — Z0000 Encounter for general adult medical examination without abnormal findings: Secondary | ICD-10-CM

## 2021-01-03 NOTE — Progress Notes (Signed)
Appointment Outcome:  Completed, Session #: 14  AGREEMENTS SECTION   Overall Goal(s): Stress management Lower LDL Maintain 150 minutes of weekly exercise Maintain healthy eating habits                                             Agreement/Action Steps:  Stress management Journal daily Quiet time with activities   Lower LDL Take Zetia as prescribed Meal prep/food journal portion control label reading   Exercise Home exercise videos (3-4x/wk - 30 minutes) Yard work Walking 2-3 [employer] Train 2x/wk for hike in March 2022 (3-4 days/wk - 10 miles)   Progress Notes:  Patient stated that she has a heavy workload during the week and don't leave work until [employer] on [address]. Patient stated that she is mindful of implementing self-care daily where she has set a cut-off time for the evening. During her wind down, the patient stated that she either read a book or watch a movie. Patient stated that writing in her journal has become more detailed. Patient is using her journal for a variety of reasons that includes tracking what she is eating, identifying positive things in her life, making out graphs related to job responsibilities. This includes prepping, unexpected events, and checking off accomplishments.   Patient reported that she lost a total of two pounds. Patient stated that she has been preparing vegan food options for comfort instead of going out to eat. These foods are low in calories and high in protein. Patient stated that she may eat fat free dairy such as yogurt, but it's low in sugar. Patient has been reading the food labels of the items that she purchases. Patient has been practicing portion control daily. Patient mentioned that she is cooking more at home even when she is tired. Patient continues to meal plan/prep for the week. Patient stated that she has been drinking lots of water and herbal tea.  Patient stated that she has resumed her PT exercises for the core.  Patient is interested in joining the [employer] in March at school to see who can lose the most of their body weight percentage. Patient stated that she has been engaged in yard work last weekend for majority of the day. Patient believes this will be another accountability tool towards helping her achieve her goals. Patient stated that she performed the home exercise video for 30 minutes 3-4 days each week. Patient is currently looking for leg weights to add to her weightlifting routine. Patient stated that she is only training twice a week for five miles at a time for the hike in March but needs to be at 3-4 days/wk for 10 miles each walk. Patient continues to walk 2-3 miles per day that is tracked by her smart watch.   Patient reported that her cholesterol check has been rescheduled for June 1st with Dr. Debara Pickett. Patient is wanting to continue working on her goal towards reducing her cholesterol until after she has this appointment. Patient has been taking Zetia as prescribed.   . Indicators of Success and Accountability:  Patient has been able to cook more at home rather than eating out for comfort and convenience.  . Readiness: Patient is in the action phase of lowering LDL cholesterol, maintaining healthy eating habits. And is in the maintenance phase of stress management and 150 minutes of weekly exercise.  Marland Kitchen  Strengths and Supports: Creativity and initiative are two strengths the patient identified. Patient has her daughter and friends as supports.  . Challenges and Barriers: Patient's work schedule may pose a challenge to training more frequently during the week.  Coaching Outcomes: Patient will aim for 3-4 days of training for hike for 10 miles at a time instead of 2 days for 5 miles by next week.   Patient will decide on whether she is going to participate in the [place] at work.  Patient will continue to implement all other action steps as outlined above.    Attempted: Marland Kitchen Fulfilled #- Patient has incorporated all action steps as agreed upon over the past two weeks.

## 2021-01-12 ENCOUNTER — Ambulatory Visit: Payer: Medicare PPO | Admitting: Internal Medicine

## 2021-01-17 ENCOUNTER — Ambulatory Visit (INDEPENDENT_AMBULATORY_CARE_PROVIDER_SITE_OTHER): Payer: Medicare PPO

## 2021-01-17 ENCOUNTER — Other Ambulatory Visit: Payer: Self-pay

## 2021-01-17 DIAGNOSIS — Z0000 Encounter for general adult medical examination without abnormal findings: Secondary | ICD-10-CM

## 2021-01-17 NOTE — Progress Notes (Signed)
Appointment Outcome:  Completed, Session #: 15   AGREEMENTS SECTION  Agreement/Action Steps:  Stress management Journal daily Quiet time with activities   Lower LDL Take Zetia as prescribed Meal prep/food journal portion control label reading   Exercise Home exercise videos (3-4x/wk - 30 minutes) Yard work Walking 2-3 [employer] Train 2x/wk for hike in March 2022 (3-4 days/wk - 10 miles)  Progress Notes:  Patient stated that writing in her journal keeps her sane. Patient mentioned that she writes a little at work now. She has added another aspect to her reflections by writing down five things that she has accomplished each day. She stated this helps her feel better about all that she is responsible for by focusing on the positive things that she has done in the course of a day.  Patient believes that self-care is vital and realizes that she causes herself stress when she feels that she has not be able to get everything that she wants to in a days' time. Patient expressed that she is pacing herself and reminds herself that it will all get done. Patient has even created a schedule for cleaning to aid in managing tasks for work, completing schoolwork, and other responsibilities to reduce the stress she is under and still be able to care for her home.  Patient continues to incorporate quiet time and activities such as completing puzzles or reading after dinner and before bed. She ensures that she have a cut off time two hours before bed to have dinner and have her quiet time. Patient stated that she has started incorporating rest breaks during the day to stop, close her eyes, and rest as a stress management technique.   Patient's recent schedule has been challenging between work and taking a class for licensure. Patient has built into her schedule time slots that she focuses on her class Patient has found that asking for help at work has been beneficial in managing stress  during certain periods. Having the support helps her prioritize responsibilities at work as well. Patient has been implementing healthy boundaries with various associates. Patient stated that saying "no" to things has helped her. This has kept the patient from adding more responsibilities than she is willing to take on at this time. Patient has been able to communicate these feelings in an assertive and healthy manner.   Patient is taking her medication as prescribed. Patient expressed that although it causes her pain, she is good at pain management. This has not been a challenge to the patient being able to engage in exercise/physical activity. Patient was not able to do yard work because of her working on class assignments on the weekends. Patient stated that the class ends on January 28, 2021, and she will be able to resume her yard work. Patient shared that when she is sitting at the computer for a long time, she stands and stretch. Patient has not been able to engage in the home videos as much or lift weights but continues to walk 2-3 miles daily. Due to her schedule, the patient has not been able to train 3-4x/week for 10 miles to prepare for the hike this month. She mentioned that her and some friends have meet to hike and run on Saturdays and hiked all day. To ensure that she is maintaining 150 minutes of weekly exercise, the patient has unpacked and set up an entire book fair alone, have rearranged [employer], and moved furniture last week.   Patient has been  able to avoid eating comfort food during this period. Patient felt challenged with eating because of her schedule but choose foods that was easy to prepare and quick to eat that's healthy. She stated that she has purchased items to make salads, vegan chili beans, quinoa and egg whites, and proteins such as lean pork loins to meal prep. Patient have continued to practice portion control and reading food labels to check for carbs, cholesterol, sugar,  and sodium.    . Indicators of Success and Accountability:  Patient stated that she is able to recognize stress verses what makes her feel better, being able to say no, and prioritizing responsibilities so she can still implement action steps. . Readiness: Patient is in the action phase of stress management and maintenance phase of 150 minutes of weekly exercise. . Strengths and Supports: Patient stated that she is relying on her faith and have been focused and optimistic. Marland Kitchen Challenges and Barriers: Current schedule and pending responsibilities may be a challenge to implementing exercise over the next two weeks.   Coaching Outcomes: The patient will continue to train for the hike 2x/wk for 10 miles. Patient will complete a 13-mile hike instead of 28-miles. Patient will resume yard work after completing her licensure class and when the weather permits.   Patient is planning a weekend with friends to hang out - will aid in stress management/social support.  Patient will continue to prioritize responsibilities via time management, maintain healthy boundaries/communication.  Patient will ask for support at work to aid in the reduction of stress when she is overwhelmed with responsibilities.   Patient will continue to implement her additional strategies for self-care when she is working on course work (e.g., rest breaks and standing and stretching).  Patient will follow a cleaning schedule each week.   Patient will continue to follow all other action steps as outlined.    Attempted: Marland Kitchen Fulfilled - Patient was able to fulfill both action steps towards stress management daily, in addition to the action steps to aid in lowering her LDL. Patient has been able to maintain walking 2-3 miles per day by using watch to monitor progress.  . Partial - Patient was able to train 2x/wk hiking 10 miles per time for the upcoming hike. Patient was able to engage in home exercise videos and lifting weights a few  days in the past two weeks.  . Not met - Patient was not able to engage in yard work over the past two weeks.

## 2021-01-24 DIAGNOSIS — A6000 Herpesviral infection of urogenital system, unspecified: Secondary | ICD-10-CM | POA: Insufficient documentation

## 2021-01-24 DIAGNOSIS — Z124 Encounter for screening for malignant neoplasm of cervix: Secondary | ICD-10-CM

## 2021-01-24 HISTORY — DX: Encounter for screening for malignant neoplasm of cervix: Z12.4

## 2021-02-03 DIAGNOSIS — R519 Headache, unspecified: Secondary | ICD-10-CM | POA: Insufficient documentation

## 2021-02-03 DIAGNOSIS — M810 Age-related osteoporosis without current pathological fracture: Secondary | ICD-10-CM | POA: Insufficient documentation

## 2021-02-03 DIAGNOSIS — M549 Dorsalgia, unspecified: Secondary | ICD-10-CM | POA: Insufficient documentation

## 2021-02-03 DIAGNOSIS — K219 Gastro-esophageal reflux disease without esophagitis: Secondary | ICD-10-CM | POA: Insufficient documentation

## 2021-02-03 DIAGNOSIS — M542 Cervicalgia: Secondary | ICD-10-CM | POA: Insufficient documentation

## 2021-02-03 DIAGNOSIS — C4491 Basal cell carcinoma of skin, unspecified: Secondary | ICD-10-CM | POA: Insufficient documentation

## 2021-02-04 ENCOUNTER — Ambulatory Visit: Payer: Medicare PPO | Admitting: Cardiology

## 2021-02-04 ENCOUNTER — Other Ambulatory Visit: Payer: Self-pay

## 2021-02-04 ENCOUNTER — Telehealth: Payer: Self-pay

## 2021-02-04 ENCOUNTER — Encounter: Payer: Self-pay | Admitting: Cardiology

## 2021-02-04 VITALS — BP 128/76 | HR 80 | Ht 68.0 in | Wt 149.0 lb

## 2021-02-04 DIAGNOSIS — R002 Palpitations: Secondary | ICD-10-CM | POA: Diagnosis not present

## 2021-02-04 DIAGNOSIS — E785 Hyperlipidemia, unspecified: Secondary | ICD-10-CM | POA: Diagnosis not present

## 2021-02-04 DIAGNOSIS — K219 Gastro-esophageal reflux disease without esophagitis: Secondary | ICD-10-CM

## 2021-02-04 DIAGNOSIS — R0789 Other chest pain: Secondary | ICD-10-CM

## 2021-02-04 NOTE — Progress Notes (Signed)
Cardiology Office Note:    Date:  02/04/2021   ID:  LANECIA SLIVA, DOB Oct 25, 1956, MRN [id number]  PCP:  Patient, No Pcp Per  Cardiologist:  Jenne Campus, MD    Referring MD: No ref. provider found   Chief Complaint  Patient presents with  . Follow-up  Doing well  History of Present Illness:    Yazlyn E Holz is a 65 y.o. female with past medical history significant for dyslipidemia with intolerance to multiple medications.  Finally she is being able to tolerate Zetia complain of having some aches with it but overall not bad and she is able to tolerate it.  Denies of any chest pain tightness squeezing pressure burning chest no palpitations no dizziness overall cardiac wise doing well.  About 2 [place] and broke one of the bone in her right hand.  She does have some cost now and she said because of this she does not do as much exercises as she did before.  Past Medical History:  Diagnosis Date  . Acid reflux   . Atypical chest pain 12/27/2018  . Back pain   . Basal cell adenocarcinoma   . Cellulitis of left lower extremity 01/26/2020   Last Assessment & Plan:  Formatting of this note might be different from the original. Mild swelling and pain on examination. No drainage. 2 other small areas just on each side of the original area.  Will treat with antibiotic. She is to notify if not improving.  . Cervical spondylolysis 10/06/2020  . Concussion 12/2000  . Dyslipidemia 12/27/2018  . Familial hyperlipidemia 07/04/2018   Last Assessment & Plan:  Formatting of this note is different from the original. Pertinent Data:   Current medication includes: diet modification and exercise This patient does not have an active medication from one of the medication groupers..  Last Lipid Values Cholesterol (mg/dl)  Date Value  04/29/2019 271 (H)  07/04/2018 306 (H)   Triglycerides (mg/dl)  Date Value  04/29/2019 214 (H)  08/22/2  . GERD (gastroesophageal  reflux disease) 04/03/2018   Last Assessment & Plan:  Formatting of this note might be different from the original. Assessment Chronic and stable No compliance issue noted with today visit  Plan Continue current treatment regimen Reviewed risk of GERD Reviewed principals of treatment Patient medication Prilosec will be continued  Last Assessment & Plan:  Formatting of this note might be different from the original. Problem co  . Headache   . History of skin cancer 10/03/2018   Last Assessment & Plan:  Formatting of this note might be different from the original. 2 small area of concern, on forehead and to the left of the nose on the bridge. No drainage. Red, irritated skin. Will refer to dermatology for further evaluation  . Hypercholesteremia   . Hyperlipidemia 07/04/2018   Last Assessment & Plan:  Formatting of this note is different from the original. Problem Complexity:  Chronic problem/illness, stable  Pertinent Data: LDL Calculated (mg/dL)  Date Value  01/09/2020 223 (H)   Cholesterol (mg/dl)  Date Value  01/09/2020 310 (H)   HDL (mg/dl)  Date Value  01/09/2020 56.5   Triglycerides (mg/dl)  Date Value  01/09/2020 151 (H)   AST (U/L)  Date Value  01/09/2020 16     . Long term use of drug 05/01/2019   Last Assessment & Plan:  Formatting of this note might be different from the original. We reviewed  age, sex, and risk factor related health maintenance interventions, largely based on the current USPSTF grade A and B recommendations.these included High blood pressure screening, diet and exercise counseling and immunization review and recommendations.  . Neck pain   . Nonintractable headache 08/05/2018  . Osteoporosis   . Palpitations 12/27/2018  . Paresthesia 10/06/2020  . Screen for colon cancer 07/06/2018   Last Assessment & Plan:  Formatting of this note might be different from the original. FOBT cards given  . Serum sickness 05/06/2020   Last Assessment & Plan:  Formatting of this note might be  different from the original. She is doing very well and is followed closely with [facility]  Last Assessment & Plan:  Formatting of this note might be different from the original. Doing well and follows with [facility].    Past Surgical History:  Procedure Laterality Date  . ABDOMINAL HYSTERECTOMY    . EYE SURGERY Right   . HERNIA REPAIR    . KNEE SURGERY Right   . WRIST SURGERY Right     Current Medications: Current Meds  Medication Sig  . acyclovir (ZOVIRAX) 400 MG tablet Take 1 tablet by mouth as needed.  Marland Kitchen aspirin 81 MG tablet Take 81 mg by mouth daily.  Marland Kitchen EPINEPHrine 0.3 mg/0.3 mL IJ SOAJ injection INJECT CONTENTS OF 1 PEN INTO OUTER THIGH FOR SEVERE ALLERGIC REACTION. CALL 911 AFTER USE.  Marland Kitchen ezetimibe (ZETIA) 10 MG tablet Take 1 tablet (10 mg total) by mouth daily.  . predniSONE (DELTASONE) 50 MG tablet Take 50 mg by mouth as needed.      Allergies:   Bee venom, Covid-19 (mrna) vaccine, Ezetimibe, Lidocaine, Shellfish allergy, Famotidine, Omeprazole, and Repatha [evolocumab]   Social History   Socioeconomic History  . Marital status: Divorced    Spouse name: Not on file  . Number of children: 2  . Years of education: masters  . Highest education level: Not on file  Occupational History  . Occupation: Retired  Tobacco Use  . Smoking status: Never Smoker  . Smokeless tobacco: Never Used  Vaping Use  . Vaping Use: Never used  Substance and Sexual Activity  . Alcohol use: Never  . Drug use: Never  . Sexual activity: Not on file  Other Topics Concern  . Not on file  Social History Narrative   Lives at home alone.   Right-handed.   Glass of tea in the morning.    Social Determinants of Health   Financial Resource Strain: Not on file  Food Insecurity: Not on file  Transportation Needs: Not on file  Physical Activity: Sufficiently Active  . Days of Exercise per Week: 5 days  . Minutes of Exercise per Session: 150+ min  Stress: Stress Concern  Present  . Feeling of Stress : Rather much  Social Connections: Not on file     Family History: The patient's family history includes Congestive Heart Failure in her father; Dementia in her mother; Diabetes in her sister and sister; Heart defect in her daughter; Heart failure in her mother; Hyperlipidemia in her brother, brother, sister, and sister; Stroke in her brother and brother. ROS:   Please see the history of present illness.    All 14 point review of systems negative except as described per history of present illness  EKGs/Labs/Other Studies Reviewed:      Recent Labs: No results found for requested labs within last 8760 hours.  Recent Lipid Panel    Component Value Date/Time  CHOL 248 (H) 09/21/2020 0808   TRIG 129 09/21/2020 0808   HDL 63 09/21/2020 0808   CHOLHDL 3.9 09/21/2020 0808   LDLCALC 162 (H) 09/21/2020 0808    Physical Exam:    VS:  BP 128/76 (BP Location: Left Arm, Patient Position: Sitting, Cuff Size: Normal)   Pulse 80   Ht 5\' 8"  (1.727 m)   Wt 149 lb (67.6 kg)   SpO2 97%   BMI 22.66 kg/m     Wt Readings from Last 3 Encounters:  02/04/21 149 lb (67.6 kg)  10/06/20 144 lb 8 oz (65.5 kg)  09/23/20 141 lb 9.6 oz (64.2 kg)     GEN:  Well nourished, well developed in no acute distress HEENT: Normal NECK: No JVD; No carotid bruits LYMPHATICS: No lymphadenopathy CARDIAC: RRR, no murmurs, no rubs, no gallops RESPIRATORY:  Clear to auscultation without rales, wheezing or rhonchi  ABDOMEN: Soft, non-tender, non-distended MUSCULOSKELETAL:  No edema; No deformity  SKIN: Warm and dry LOWER EXTREMITIES: no swelling NEUROLOGIC:  Alert and oriented x 3 PSYCHIATRIC:  Normal affect   ASSESSMENT:    1. Dyslipidemia   2. Palpitations   3. Gastroesophageal reflux disease without esophagitis   4. Atypical chest pain    PLAN:    In order of problems listed above:  1. Dyslipidemia.  Intolerance to multiple medications, last HDL 63 with LDL of 162.   She does have however multiple family members with CVA.  Therefore we need to be aggressive in management of this problem.  Overall her LDL improved dramatically from above 200-162 but still not at desirable level which should be less than 100.  Luckily she is tolerating Zetia.  I offer her to have her cholesterol rechecked and if cholesterol is not where it supposed to be which I think will be the case we may consider using Leqvio. 2. Atypical chest pain: Denies having any. 3. Palpitations denies having any. 4. Gastroesophageal reflux disease seems to be controlled.   5. We did talk about healthy lifestyle need to exercise on the regular basis which she does.   Medication Adjustments/Labs and Tests Ordered: Current medicines are reviewed at length with the patient today.  Concerns regarding medicines are outlined above.  Orders Placed This Encounter  Procedures  . Lipid panel   Medication changes: No orders of the defined types were placed in this encounter.   Signed, Park Liter, MD, [facility] 02/04/2021 1:59 PM    [place]

## 2021-02-04 NOTE — Telephone Encounter (Signed)
Will route to Avelino Leeds to reschedule.   Thank you!

## 2021-02-04 NOTE — Telephone Encounter (Signed)
Patient needed to speak to Avelino Leeds to re-schedule her appointment she has on Monday 02/07/21 at 4:30 pm. The patient has a conflicting appointment regarding her broken hand. She is available any other Monday to be re-scheduled.

## 2021-02-04 NOTE — Patient Instructions (Signed)
Medication Instructions:  Your physician recommends that you continue on your current medications as directed. Please refer to the Current Medication list given to you today.  *If you need a refill on your cardiac medications before your next appointment, please call your pharmacy*   Lab Work: Your physician recommends that you return for lab work when fasting: lipid   If you have labs (blood work) drawn today and your tests are completely normal, you will receive your results only by: Marland Kitchen MyChart Message (if you have MyChart) OR . A paper copy in the mail If you have any lab test that is abnormal or we need to change your treatment, we will call you to review the results.   Testing/Procedures: None   Follow-Up: At [facility], you and your health needs are our priority.  As part of our continuing mission to provide you with exceptional heart care, we have created designated Provider Care Teams.  These Care Teams include your primary Cardiologist (physician) and Advanced Practice Providers (APPs -  Physician Assistants and Nurse Practitioners) who all work together to provide you with the care you need, when you need it.  We recommend signing up for the patient portal called "MyChart".  Sign up information is provided on this After Visit Summary.  MyChart is used to connect with patients for Virtual Visits (Telemedicine).  Patients are able to view lab/test results, encounter notes, upcoming appointments, etc.  Non-urgent messages can be sent to your provider as well.   To learn more about what you can do with MyChart, go to [email or web address].    Your next appointment:   1 year(s)  The format for your next appointment:   In Person  Provider:   Jenne Campus, MD   Other Instructions

## 2021-02-07 ENCOUNTER — Ambulatory Visit: Payer: Self-pay

## 2021-02-07 ENCOUNTER — Telehealth: Payer: Self-pay

## 2021-02-07 DIAGNOSIS — Z0000 Encounter for general adult medical examination without abnormal findings: Secondary | ICD-10-CM

## 2021-02-07 NOTE — Telephone Encounter (Signed)
Patient returned called to reschedule appointment for any available Monday at 4:30pm. Patient has been scheduled for April 4th at 4:30pm.

## 2021-02-07 NOTE — Telephone Encounter (Signed)
Called patient to reschedule appointment for another Monday as patient requested. Appointment for March 28th at 4:30pm has been cancelled per patient's request because of another appointment scheduled for broken hand. Left patient a message to return call to determine which Monday will work best for her schedule.

## 2021-02-07 NOTE — Telephone Encounter (Signed)
Called patient and left message to call to determine which Monday will work best for rescheduling. Appointment for March 28th at 4:30pm has been cancelled.

## 2021-02-14 ENCOUNTER — Other Ambulatory Visit: Payer: Self-pay

## 2021-02-14 ENCOUNTER — Ambulatory Visit (INDEPENDENT_AMBULATORY_CARE_PROVIDER_SITE_OTHER): Payer: Medicare PPO

## 2021-02-14 DIAGNOSIS — Z0000 Encounter for general adult medical examination without abnormal findings: Secondary | ICD-10-CM
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
## 2021-02-24 DIAGNOSIS — J00 Acute nasopharyngitis [common cold]: Secondary | ICD-10-CM | POA: Insufficient documentation

## 2021-02-24 DIAGNOSIS — Z20822 Contact with and (suspected) exposure to covid-19: Secondary | ICD-10-CM

## 2021-02-24 DIAGNOSIS — J029 Acute pharyngitis, unspecified: Secondary | ICD-10-CM

## 2021-02-24 DIAGNOSIS — H1032 Unspecified acute conjunctivitis, left eye: Secondary | ICD-10-CM

## 2021-02-24 DIAGNOSIS — R6889 Other general symptoms and signs: Secondary | ICD-10-CM

## 2021-02-24 HISTORY — DX: Contact with and (suspected) exposure to covid-19: Z20.822

## 2021-02-24 HISTORY — DX: Unspecified acute conjunctivitis, left eye: H10.32

## 2021-02-24 HISTORY — DX: Acute pharyngitis, unspecified: J02.9

## 2021-02-24 HISTORY — DX: Other general symptoms and signs: R68.89

## 2021-03-07 ENCOUNTER — Ambulatory Visit (INDEPENDENT_AMBULATORY_CARE_PROVIDER_SITE_OTHER): Payer: Medicare PPO

## 2021-03-07 ENCOUNTER — Other Ambulatory Visit: Payer: Self-pay

## 2021-03-07 DIAGNOSIS — Z0000 Encounter for general adult medical examination without abnormal findings: Secondary | ICD-10-CM

## 2021-03-07 NOTE — Progress Notes (Signed)
Appointment Outcome:  Completed, Session #: 17 Start time: 4:30pm   End time: 5:10pm   Total Mins: 40 mins   AGREEMENTS SECTION   Overall Goal(s): Stress management Maintain 150 minutes of weekly exercise Maintain healthy eating habits            Lower LDL                                  Agreement/Action Steps:  Stress management Quiet time with activities Support at work Rest breaks from computer Conduct self-check-ins Positive self-talk   Lower LDL Take Zetia as prescribed Meal prep/food journal Practice portion control Read food labels   Exercise Yard work/cleaning schedule Walking 2-3 miles/day   Progress Notes:  Patient stated that she has been able to maintain all of her stress management strategies but have used rest breaks differently because she has not started classes yet, and students have helped provide support at work on occasions. 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 Patient shared that she ensures that she wind down in the evenings regardless of the time, even if it's 4:30pm when she starts her quiet time with activities. Patient stated that she has been listening to her body to make sure she gets the rest that she needs. Patient has continued to have a positive outlook during this time by conducting self-check-ins to recognize how she was feeling and what she needed in moments of stress and encouraging herself when needed. Patient stated that she has learned that everything will right itself and that she will continue living in the moment. Patient realized she needed to relieve stress by taking time to heal and relax.   Patient shared that she has begun to simplify her responsibilities at work and have decided not to take on any formal commitments at church. However, she did offer some assistance to contribute some form of help to where she would not  overwhelm herself. Patient stated that this has helped to keep her stress level down as well.   Patient has been battling serum sickness for the past three weeks, and healing from a broken hand. This has made some of her steps challenging to carry out as normal but was incorporated on a schedule with breaks. Patient has been able to clean her home on a schedule. Patient has done some yard work while at home but limited to the normal types of activities she would do and modified them to remain active. Patient has been able to maintain walking 2-3 miles per day while at school. During her trip to [place], she was able to manage 4-5 miles per day with walking to the park, around [employer], walking stairs, and walking the dog every day. This past Sunday, she walked 3 miles. After patient returned from her trip, she began her yard work and house cleaning again. Patient is planning to participate in a 5k run this Saturday  While patient was on her trip, she opted to eat veggie bowls, tuna, salmon, chicken, and a slice of beef. She consumed olive and grape seed oils. These were the choices that she made although she was not in control of preparing her foods. To fight altitude sickness, patient did eat more carbs than normal, but balanced it with six small meals. Patient continued to practice portion control.  Prior to her trip, patient continued to read food labels and meal prep/food journal and returned to the same practices after her trip.    . Indicators of Success and Accountability:  Patient stated that she feels that her being able to continue to get up and keep moving regardless of her setbacks, and not allowing her body to control her moving towards her goals.  . Readiness: How ready are you to make the changes you have identified?  Rate your readiness on a 1-5 scale with 5 being the readiest.  . Strengths and Supports: Patient's daughter, daughter-in-law, and son-in-law have been supporting her.  Patient stated that her faith has been her strength. Patient is motivated and is drawing from previous experience as well.  . Challenges and Barriers: Patient has been dealing with serum sickness and had a broken hand in the past three weeks that made it challenging to implement some of her steps as normal. Patient also was out of town and not in control of her cooking during a week.   Coaching Outcomes: Patient will continue to follow her action steps as outlined. Patient will initiate rest breaks from the computer starting this week since classes are starting now to help manage stress. Although these are not official steps, patient has been practicing and will continue to practice being able to say no to additional responsibilities and being comfortable with it. Patient will continue to streamline actions to simplify her responsibilities as well to manage stress.    Attempted: Marland Kitchen Fulfilled - Patient was able to maintain walking 2-3 miles per day or more, patient is maintaining quiet time with activities, patient continues to conduct self-check-ins and incorporates positive self-talk. Patient is taking Zetia as prescribed and practicing portion control. . Partial - Patient has not started rest breaks from the computer for classes yet but incorporated rest breaks for other reasons. Patient has meal prepped/kept food journal and reading food labels while at home. Patient was able to get some support from students at work. Patient has been able to engage in yard work/cleaning schedule when at home and when able.

## 2021-03-21 ENCOUNTER — Ambulatory Visit: Payer: Self-pay

## 2021-03-29 ENCOUNTER — Telehealth: Payer: Self-pay

## 2021-03-29 DIAGNOSIS — Z0000 Encounter for general adult medical examination without abnormal findings: Secondary | ICD-10-CM

## 2021-03-29 NOTE — Telephone Encounter (Signed)
Called patient to reschedule health coaching appointment. Left patient a message to return call to Care Guide at [phone] to reschedule appointment.

## 2021-03-29 NOTE — Telephone Encounter (Signed)
Patient returned Care Guide's call to reschedule health coaching appointment. Patient has been scheduled for Monday 5/23 at 4:30pm for an in-person health coaching session.

## 2021-04-04 ENCOUNTER — Other Ambulatory Visit: Payer: Self-pay

## 2021-04-04 ENCOUNTER — Ambulatory Visit (INDEPENDENT_AMBULATORY_CARE_PROVIDER_SITE_OTHER): Payer: Medicare PPO

## 2021-04-04 DIAGNOSIS — Z0000 Encounter for general adult medical examination without abnormal findings: Secondary | ICD-10-CM
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
## 2021-04-08 ENCOUNTER — Telehealth: Payer: Self-pay | Admitting: Internal Medicine

## 2021-04-08 NOTE — Telephone Encounter (Signed)
-----   Message from Pixie Casino, MD sent at 04/06/2021  9:51 AM EDT ----- Regarding: RE: Labs/Reschedule June 1 appt [place] for her to reschedule .. just try to get labs 1-2 weeks before new appt.  Dr Lemmie Evens  ----- Message ----- From: Avelino Leeds Sent: 04/06/2021   9:13 AM EDT To: Pixie Casino, MD, Fidel Levy, RN Subject: Labs/Reschedule June 1 appt                    Hi Dr. Debara Pickett and Eliezer Lofts,  This patient is experiencing serum sickness and is currently taking Prednisone at this time. She is afraid that this has raised her cholesterol again because she just recently had an incident where she had to take the medication for some time and now she is taking it again.   She also has a dilemma with her work schedule and being able to get her labs done before her appointment or being able to make her appointment because of being assigned to end-of-grade testing. Would it be possible to reschedule her June 1st appointment? This may give her time to allow her body to recover some from the medication and for testing to end.   Thanks, Amy

## 2021-04-08 NOTE — Telephone Encounter (Signed)
Left message to call back in regards to message below.   Advised patient she can speak with operator to cancel her 04/13/21 visit, r/s for 3-4 months, have fasting labs prior

## 2021-04-13 ENCOUNTER — Ambulatory Visit: Payer: Medicare PPO | Admitting: Internal Medicine

## 2021-05-02 ENCOUNTER — Other Ambulatory Visit: Payer: Self-pay

## 2021-05-02 ENCOUNTER — Ambulatory Visit (INDEPENDENT_AMBULATORY_CARE_PROVIDER_SITE_OTHER): Payer: Medicare PPO

## 2021-05-02 DIAGNOSIS — Z0000 Encounter for general adult medical examination without abnormal findings: Secondary | ICD-10-CM

## 2021-05-02 NOTE — Progress Notes (Signed)
Appointment Outcome:  Completed, Session #: 1 month f/u Start time: 4:28pm   End time: 5:13pm   Total Mins: 45 minutes  AGREEMENTS SECTION   Overall Goal(s): Stress management Maintain 150 minutes of weekly exercise Maintain healthy eating habits            Lower LDL                                  Agreement/Action Steps: Stress management Quiet time with activities Rest breaks from computer Conduct self-check-ins Positive self-talk   Lower LDL Take Zetia as prescribed Meal prep/food journal Practice portion control Read food labels   Exercise Yard work/cleaning schedule Walking 2-3 miles/day  Progress Notes:  Patient stated that she has been moving a lot of furniture in her home. Patient shared that she has been working in her yard to build raised garden beds and to relocate/replant flowers in her yard. Patient mentioned that she continues to walk 2-3 miles/day while at school. Patient stated that she is constantly moving/walking during the day. 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 Patient will be working with students 2 hrs. in the am and 2 hrs. in the pm. In between time, the patient will be walking around in [employer].  Patient continues to read during her quiet time. Patient also finds herself in the sunroom looking outside at her flowers to decompress in the evenings. Patient continues to conduct self-check-ins throughout the day and implement positive self-talk to aid in reducing stress. Patient feels that this helps her a lot. Patient stated that her classes have ended, and she isn't having to take breaks from the computer as much because she is not using it for long periods of time. Patient stated that after she conducts her research on what she will be doing, she writes out the information that she needs at the time and get up, which makes it a lot easier than sitting for long periods of  time.   Patient is taking Zetia as prescribed. Patient is concerned that since she has been constantly taking Prednisone that is has made her gain weight and that it will impact the results of her cholesterol labs. Patient shared that she was stung by fire ants again and tried to go without taking the medication, but it had to resume taking it.   Patient stated that since she is taking this medication, she has been hungry all the time. Patient stated that restaurants don't have the healthiest choices and expressed that she is making the healthiest choices possible during this time since she is unable to cook inside her kitchen. Patient stated that she does take a salad to work and use humus or guacamole as her protein instead of animal products. Patient feels that she has been doing okay with her eating habits under these circumstances outside of eating sweets in the past month because of retirement party and events at school she attended and ate at.   Patient stated that she is eating more vegetables, but it has been hard to really monitor her food intake since she does not have a kitchen. Patient is practicing portion control with her meals and read food labels when the information is available. Patient mentioned that her daughter and son-in-law sent her a vegan cookbook and some money in order purchase food and prepare  healthy meals.   Patient shared that she is going to [place] for two weeks and will be leaving on the 30th.   Patient shared that she has been rescheduled for her cholesterol check with Dr. Debara Pickett on July 27th.   Indicators of Success and Accountability:  Patient stated that staying positive and breaking tasks down into steps to get things done are her indicators of success.  Readiness: Patient is in the action phase of stress management, maintaining 150 minutes of weekly exercise, maintaining healthy eating habits to lower LDL.  Strengths and Supports: Patient is being supported by  her daughter and son-in-law at this time. Patient stated that her strength is her faith/spirituality.  Challenges and Barriers: Patient is not able to cook in her own kitchen currently and it interferes with meal planning/prepping.   Coaching Outcomes: Care Guide reminded patient that she is to complete fasting labs 1-2 weeks before her appointment on July 27th.   Patient will continue to implement action steps as outlined above.   Patient has been scheduled for her follow up appointment 1 month from now after her return from her [place] trip.    Attempted: Fulfilled - Patient has been able to complete steps for exercise, stress management, practice portion control, and taking Zetia as prescribed. Partial - Patient has been able to read some food labels, and meal plan/prep.

## 2021-06-03 ENCOUNTER — Telehealth: Payer: Self-pay

## 2021-06-03 DIAGNOSIS — Z0000 Encounter for general adult medical examination without abnormal findings: Secondary | ICD-10-CM

## 2021-06-03 NOTE — Telephone Encounter (Signed)
Called patient to reschedule health coaching session due to having a schedule conflict. Patient agreed to rescheduled and her appointment has been moved to August 22nd at 4:30pm. Patient will be seen in-person.

## 2021-06-06 ENCOUNTER — Ambulatory Visit: Payer: Medicare PPO

## 2021-06-06 LAB — LIPID PANEL
Chol/HDL Ratio: 5.2 ratio — ABNORMAL HIGH (ref 0.0–4.4)
Cholesterol, Total: 234 mg/dL — ABNORMAL HIGH (ref 100–199)
HDL: 45 mg/dL (ref >39)
LDL Chol Calc (NIH): 141 mg/dL — ABNORMAL HIGH (ref 0–99)
Triglycerides: 263 mg/dL — ABNORMAL HIGH (ref 0–149)
VLDL Cholesterol Cal: 48 mg/dL — ABNORMAL HIGH (ref 5–40)

## 2021-06-08 ENCOUNTER — Ambulatory Visit (HOSPITAL_BASED_OUTPATIENT_CLINIC_OR_DEPARTMENT_OTHER): Payer: Medicare PPO | Admitting: Internal Medicine

## 2021-06-08 ENCOUNTER — Encounter (HOSPITAL_BASED_OUTPATIENT_CLINIC_OR_DEPARTMENT_OTHER): Payer: Self-pay | Admitting: Internal Medicine

## 2021-06-08 ENCOUNTER — Other Ambulatory Visit: Payer: Self-pay

## 2021-06-08 VITALS — BP 132/64 | HR 75 | Ht 68.0 in | Wt 152.0 lb

## 2021-06-08 DIAGNOSIS — E7849 Other hyperlipidemia: Secondary | ICD-10-CM | POA: Diagnosis not present

## 2021-06-08 DIAGNOSIS — Z789 Other specified health status: Secondary | ICD-10-CM | POA: Diagnosis not present

## 2021-06-08 DIAGNOSIS — E785 Hyperlipidemia, unspecified: Secondary | ICD-10-CM

## 2021-06-08 NOTE — Patient Instructions (Addendum)
Medication Instructions:  Rebecca Montgomery is a subcutaneous injection that will lower your LDL cholesterol by 50%. If this is your first injection, your next injection will be due in 3 months. If this is NOT your first injection, your next injection will be due in 6 months. If you have not heard from [facility] to schedule your next appointment within 2 weeks of your next anticipated injection, please call [facility] to schedule this at:                [phone] - if you are seen at the [facility] office               [phone] - if you are seen at the [facility] office   Your Leqvio injection is scheduled on _________ at ___________. Please arrive at [facility] and enter the hospital through [place] (the [employer]) that's located at [employer] 15 minutes before your scheduled injection time. Walk in to the registration desk and let them know that you are scheduled for an injection in the infusion center. They will register you and take you to your appointment.    *If you need a refill on your cardiac medications before your next appointment, please call your pharmacy*   Lab Work: FASTING lab work in 6 months to check cholesterol   If you have labs (blood work) drawn today and your tests are completely normal, you will receive your results only by: [place] (if you have MyChart) OR A paper copy in the mail If you have any lab test that is abnormal or we need to change your treatment, we will call you to review the results.   Testing/Procedures: NONE   Follow-Up: At [facility], you and your health needs are our priority.  As part of our continuing mission to provide you with exceptional heart care, we have created designated Provider Care Teams.  These Care Teams include your primary Cardiologist (physician) and Advanced Practice Providers (APPs -  Physician Assistants and Nurse Practitioners) who all work together to provide you  with the care you need, when you need it.  We recommend signing up for the patient portal called "MyChart".  Sign up information is provided on this After Visit Summary.  MyChart is used to connect with patients for Virtual Visits (Telemedicine).  Patients are able to view lab/test results, encounter notes, upcoming appointments, etc.  Non-urgent messages can be sent to your provider as well.   To learn more about what you can do with MyChart, go to [email or web address].    Your next appointment:   6 month(s) - lipid clinic  The format for your next appointment:   In Person  Provider:   K. Mali [name], MD   Other Instructions

## 2021-06-08 NOTE — Progress Notes (Signed)
LIPID CLINIC CONSULT NOTE  Chief Complaint:  Manage dyslipidemia  Primary Care Physician: Patient, No Pcp Per (Inactive)  Primary Cardiologist:  None  HPI:  Rebecca Montgomery is a 65 y.o. female who is being seen today for the evaluation of dyslipidemia at the request of No ref. provider found.  This is a pleasant 66 year old female with longstanding history of dyslipidemia probably with the notable elevation in her cholesterol since she was told in her 51s.  She has a strong family history of heart disease and stroke including 2 brothers who had strokes in their 63s and 43s.  There is also family history of high triglycerides although her triglycerides now are better controlled but has been as high as 294 in the past.  Findings are highly concerning for familial hyperlipidemia or possibly familial combined hyperlipidemia.  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  She has had some worsening memory difficulty/memory fog on statins in the past and has been tried on several including atorvastatin, rosuvastatin and others.  She is averse to taking a statin medication.  Her most recent lipid showed total cholesterol 310, triglycerides 151, HDL 56 and LDL of 223.  She reports a very heart healthy diet in fact nearly vegan diet.  Her weight generally is around 120 pounds however recently she had a MRSA infection as well as an anaphylactic reaction to the modality vaccine which took several weeks to recover from.  She also has a history of anaphylaxis and serum sickness and allergies to shellfish and bee venom as well as fire ants.  09/23/2020  Rebecca Montgomery returns today for follow-up.  Her lipids are improved despite not being on therapy.  She is made dietary changes, more physical activity and other things and has come off of some steroid medications that she had been taking more regularly.  All of this may have contributed to improvement in her lipids with the most recent total  cholesterol 248, triglycerides 129, HDL 63 and LDL 162.  Overall this is significant improvement however I suspect cannot be much lower as she is maximized her lifestyle modifications.  Because of her family history of stroke I would try to target LDL lower ideally at 100 if we can achieve that.  We discussed several other options for possible therapies.  06/08/2021  Rebecca Montgomery returns today for follow-up.  She is noted she has had a lot of challenges recently with her overall health and family issues as well as some issues with her home that has not allowed her to eat normally.  Despite this and some weight gain with steroids, her cholesterol is lower.  Now total cholesterol 234, triglycerides 263, HDL 45 and LDL 141, down from 162.  She is taking ezetimibe which was falsely listed as causing anaphylaxis.  This is been removed from her allergies.  Although her numbers are better, still well above target.  She does have a likely FH with untreated LDL cholesterol 223.  Unfortunately she cannot tolerate the PCSK9 antibody inhibitors.  We discussed other options including Leqvio, which may be a good option for her.   PMHx:  Past Medical History:  Diagnosis Date   Acid reflux    Atypical chest pain 12/27/2018   Back pain    Basal cell adenocarcinoma    Cellulitis of left lower extremity 01/26/2020   Last Assessment & Plan:  Formatting of this note might be different from the original. Mild swelling and pain on  examination. No drainage. 2 other small areas just on each side of the original area.  Will treat with antibiotic. She is to notify if not improving.   Cervical spondylolysis 10/06/2020   Concussion 12/2000   Dyslipidemia 12/27/2018   Familial hyperlipidemia 07/04/2018   Last Assessment & Plan:  Formatting of this note is different from the original. Pertinent Data:   Current medication includes: diet modification and exercise This patient does not have an active medication from one of the  medication groupers..  Last Lipid Values Cholesterol (mg/dl)  Date Value  04/29/2019 271 (H)  07/04/2018 306 (H)   Triglycerides (mg/dl)  Date Value  04/29/2019 214 (H)  08/22/2   GERD (gastroesophageal reflux disease) 04/03/2018   Last Assessment & Plan:  Formatting of this note might be different from the original. Assessment Chronic and stable No compliance issue noted with today visit  Plan Continue current treatment regimen Reviewed risk of GERD Reviewed principals of treatment Patient medication Prilosec will be continued  Last Assessment & Plan:  Formatting of this note might be different from the original. Problem co   Headache    History of skin cancer 10/03/2018   Last Assessment & Plan:  Formatting of this note might be different from the original. 2 small area of concern, on forehead and to the left of the nose on the bridge. No drainage. Red, irritated skin. Will refer to dermatology for further evaluation   Hypercholesteremia    Hyperlipidemia 07/04/2018   Last Assessment & Plan:  Formatting of this note is different from the original. Problem Complexity:  Chronic problem/illness, stable  Pertinent Data: LDL Calculated (mg/dL)  Date Value  01/09/2020 223 (H)   Cholesterol (mg/dl)  Date Value  01/09/2020 310 (H)   HDL (mg/dl)  Date Value  01/09/2020 56.5   Triglycerides (mg/dl)  Date Value  01/09/2020 151 (H)   AST (U/L)  Date Value  01/09/2020 16      Long term use of drug 05/01/2019   Last Assessment & Plan:  Formatting of this note might be different from the original. We reviewed age, sex, and risk factor related health maintenance interventions, largely based on the current USPSTF grade A and B recommendations.these included High blood pressure screening, diet and exercise counseling and immunization review and recommendations.   Neck pain    Nonintractable headache 08/05/2018   Osteoporosis    Palpitations 12/27/2018   Paresthesia 10/06/2020   Screen for colon cancer 07/06/2018   Last  Assessment & Plan:  Formatting of this note might be different from the original. FOBT cards given   Serum sickness 05/06/2020   Last Assessment & Plan:  Formatting of this note might be different from the original. She is doing very well and is followed closely with [facility]  Last Assessment & Plan:  Formatting of this note might be different from the original. Doing well and follows with [facility].    Past Surgical History:  Procedure Laterality Date   ABDOMINAL HYSTERECTOMY     EYE SURGERY Right    HERNIA REPAIR     KNEE SURGERY Right    WRIST SURGERY Right     FAMHx:  Family History  Problem Relation Age of Onset   Dementia Mother        heavy smoker   Heart failure Mother    Congestive Heart Failure Father    Diabetes Sister    Hyperlipidemia Sister    Stroke Brother    Hyperlipidemia  Brother    Diabetes Sister    Hyperlipidemia Sister    Stroke Brother    Hyperlipidemia Brother    Heart defect Daughter     SOCHx:   reports that she has never smoked. She has never used smokeless tobacco. She reports that she does not drink alcohol and does not use drugs.  ALLERGIES:  Allergies  Allergen Reactions   Bee Venom Other (See Comments)    Serum sickness reported with insect bite. Has been treated with prednisone only in the past per patient.  Also, fire ants.  Anaphylaxis reaction.   Covid-19 (Mrna) Vaccine Anaphylaxis   Ezetimibe Anaphylaxis   Lidocaine Anaphylaxis    ALL CAINES PER PATIENT.   Shellfish Allergy Anaphylaxis   Famotidine    Omeprazole    Repatha [Evolocumab] Hives and Itching    ROS: Pertinent items noted in HPI and remainder of comprehensive ROS otherwise negative.  HOME MEDS: Current Outpatient Medications on File Prior to Visit  Medication Sig Dispense Refill   acyclovir (ZOVIRAX) 400 MG tablet Take 1 tablet by mouth as needed.     aspirin 81 MG tablet Take 81 mg by mouth daily.     EPINEPHrine 0.3 mg/0.3 mL IJ SOAJ  injection INJECT CONTENTS OF 1 PEN INTO OUTER THIGH FOR SEVERE ALLERGIC REACTION. CALL 911 AFTER USE.  1   ezetimibe (ZETIA) 10 MG tablet Take 1 tablet (10 mg total) by mouth daily. 90 tablet 3   predniSONE (DELTASONE) 50 MG tablet Take 50 mg by mouth as needed.      No current facility-administered medications on file prior to visit.    LABS/IMAGING: Results for orders placed or performed in visit on 02/04/21 (from the past 48 hour(s))  Lipid panel     Status: Abnormal   Collection Time: 06/06/21  9:10 AM  Result Value Ref Range   Cholesterol, Total 234 (H) 100 - 199 mg/dL   Triglycerides 263 (H) 0 - 149 mg/dL   HDL 45 >39 mg/dL   VLDL Cholesterol Cal 48 (H) 5 - 40 mg/dL   LDL Chol Calc (NIH) 141 (H) 0 - 99 mg/dL   Chol/HDL Ratio 5.2 (H) 0.0 - 4.4 ratio    Comment:                                   T. Chol/HDL Ratio                                             Men  Women                               1/2 Avg.Risk  3.4    3.3                                   Avg.Risk  5.0    4.4                                2X Avg.Risk  9.6    7.1  3X Avg.Risk 23.4   11.0    No results found.  LIPID PANEL:    Component Value Date/Time   CHOL 234 (H) 06/06/2021 0910   TRIG 263 (H) 06/06/2021 0910   HDL 45 06/06/2021 0910   CHOLHDL 5.2 (H) 06/06/2021 0910   LDLCALC 141 (H) 06/06/2021 0910    WEIGHTS: Wt Readings from Last 3 Encounters:  06/08/21 152 lb (68.9 kg)  02/04/21 149 lb (67.6 kg)  10/06/20 144 lb 8 oz (65.5 kg)    VITALS: BP 132/64   Pulse 75   Ht '5\' 8"'$  (1.727 m)   Wt 152 lb (68.9 kg)   SpO2 97%   BMI 23.11 kg/m   EXAM: Deferred  EKG: Deferred  ASSESSMENT: Possible familial hyperlipidemia-Dutch score 8 Statin intolerance-memory loss Family history of premature stroke  PLAN: 1.   Rebecca Montgomery has had some improvement in her lipids despite dietary challenges.  I suspect her cholesterol may go even lower with some improvement in her  diet.  She is still well above target LDL and I think could benefit from additional lipid lowering.  She may be a candidate for Leqvio other were having some challenges in getting that the patient's at this time.  I will go ahead and refer her for this therapy.  She will go ahead and continue to work on her diet and stay on the Zetia.  Plan follow-up with me in 6 months with repeat lipids.  Pixie Casino, MD, [facility], Will Director of the [facility]  Cardiovascular Risk Reduction Clinic Diplomate of the American Board of Clinical Lipidology Attending Cardiologist  Direct Dial: [phone]  Fax: [phone]  Website:  [email or web address] [email or web address] 06/08/2021, 8:27 AM

## 2021-07-04 ENCOUNTER — Other Ambulatory Visit: Payer: Self-pay

## 2021-07-04 ENCOUNTER — Ambulatory Visit (INDEPENDENT_AMBULATORY_CARE_PROVIDER_SITE_OTHER): Payer: Medicare PPO

## 2021-07-04 DIAGNOSIS — Z0000 Encounter for general adult medical examination without abnormal findings: Secondary | ICD-10-CM
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
## 2021-07-25 ENCOUNTER — Other Ambulatory Visit: Payer: Self-pay

## 2021-07-25 ENCOUNTER — Ambulatory Visit (INDEPENDENT_AMBULATORY_CARE_PROVIDER_SITE_OTHER): Payer: Medicare PPO

## 2021-07-25 DIAGNOSIS — Z0000 Encounter for general adult medical examination without abnormal findings: Secondary | ICD-10-CM
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
## 2021-08-08 ENCOUNTER — Ambulatory Visit: Payer: Medicare PPO

## 2021-08-18 ENCOUNTER — Telehealth: Payer: Self-pay | Admitting: Internal Medicine

## 2021-08-18 NOTE — Telephone Encounter (Signed)
Will route to [place] E- per Mychart message she was advised she would be out of town and would do this when she returned.   Thanks!

## 2021-08-18 NOTE — Telephone Encounter (Signed)
   Pt said she called medicare and she was told minimum of 4 weeks to get her card in the mail. However, she was given her number [id number] and was told she can use this to get everything she needed

## 2021-08-23 NOTE — Telephone Encounter (Signed)
Leqvio portal enrollment submitted Patient updated via MyChart

## 2021-08-23 NOTE — Telephone Encounter (Signed)
Leqvio portal enrollment completed. Patient notified of this via MyChart

## 2021-08-30 NOTE — Telephone Encounter (Signed)
PA for [facility] faxed to [facility] @ [phone]  Patient has a $4000 OOP max As of 08/24/21, she has met $400.07 Leqvio is covered at 80% until she meets the $4000 OOP max  She has been notified of this - patient assistance info sent via MyChart 10/13

## 2021-09-21 ENCOUNTER — Other Ambulatory Visit: Payer: Self-pay | Admitting: Internal Medicine

## 2021-09-30 NOTE — Telephone Encounter (Signed)
Called [employer] to check on status of PA Refaxed with new EOC ID: [id number]

## 2021-10-03 NOTE — Telephone Encounter (Signed)
Patient approved for coverage of LEQVIO with [employer] until 03/29/2022

## 2021-12-14 ENCOUNTER — Telehealth: Payer: Self-pay | Admitting: Internal Medicine

## 2021-12-14 NOTE — Telephone Encounter (Signed)
New Message:     Please call, concerning the new Cholesterol medicine.

## 2021-12-16 NOTE — Telephone Encounter (Signed)
Update on Leqvio sent to patient via MyChart

## 2021-12-29 DIAGNOSIS — Z780 Asymptomatic menopausal state: Secondary | ICD-10-CM

## 2021-12-29 DIAGNOSIS — R21 Rash and other nonspecific skin eruption: Secondary | ICD-10-CM | POA: Insufficient documentation

## 2021-12-29 HISTORY — DX: Asymptomatic menopausal state: Z78.0

## 2022-01-06 NOTE — Telephone Encounter (Signed)
Called patient with update on leqvio process, her BI form  ($4000 OOP max, $0 MET) AND 20% co-insurance. She is willing to pay for this medication. Advised will try to get free-trial-offer medication for her first.

## 2022-01-12 DIAGNOSIS — S0990XA Unspecified injury of head, initial encounter: Secondary | ICD-10-CM | POA: Insufficient documentation

## 2022-01-12 DIAGNOSIS — R42 Dizziness and giddiness: Secondary | ICD-10-CM

## 2022-01-12 DIAGNOSIS — I159 Secondary hypertension, unspecified: Secondary | ICD-10-CM | POA: Insufficient documentation

## 2022-01-12 DIAGNOSIS — E559 Vitamin D deficiency, unspecified: Secondary | ICD-10-CM

## 2022-01-12 DIAGNOSIS — H9319 Tinnitus, unspecified ear: Secondary | ICD-10-CM

## 2022-01-12 HISTORY — DX: Tinnitus, unspecified ear: H93.19

## 2022-01-12 HISTORY — DX: Vitamin D deficiency, unspecified: E55.9

## 2022-01-12 HISTORY — DX: Unspecified injury of head, initial encounter: S09.90XA

## 2022-01-12 HISTORY — DX: Dizziness and giddiness: R42

## 2022-01-13 NOTE — Telephone Encounter (Signed)
Leqvio FTO form faxed 01/13/22 ?

## 2022-01-26 DIAGNOSIS — Z6823 Body mass index (BMI) 23.0-23.9, adult: Secondary | ICD-10-CM

## 2022-01-26 DIAGNOSIS — Z7185 Encounter for immunization safety counseling: Secondary | ICD-10-CM

## 2022-01-26 HISTORY — DX: Body mass index (BMI) 23.0-23.9, adult: Z68.23

## 2022-01-26 HISTORY — DX: Encounter for immunization safety counseling: Z71.85

## 2022-02-15 ENCOUNTER — Ambulatory Visit: Payer: Medicare PPO | Admitting: *Deleted

## 2022-02-15 VITALS — BP 142/83 | HR 72

## 2022-02-15 DIAGNOSIS — E7849 Other hyperlipidemia: Secondary | ICD-10-CM

## 2022-02-15 MED ORDER — INCLISIRAN SODIUM 284 MG/1.5ML ~~LOC~~ SOSY: 284.0000 mg | PREFILLED_SYRINGE | Freq: Once | SUBCUTANEOUS | 0 refills | Status: AC

## 2022-02-15 NOTE — Patient Instructions (Signed)
Your first LEQVIO injection was administered on 02/15/2022 ?Your next injection is due approximately 05/17/2022 ? ?For patient assistance, please provide proof of income - such as W2, 1099, social security statement, bank stub. This will be submitted to [employer] patient assistance foundation.  ? ?If approved, your injections will be done at [facility] ? ?If denied and you wish to continue with Leqvio injections, this will be done at [facility] infusion clinic and billed to insurance.  ?

## 2022-02-15 NOTE — Progress Notes (Signed)
? ?  Nurse Visit  ? ?Date of Encounter: 02/15/2022 ?ID: Rebecca Montgomery, DOB Apr 09, 1956, MRN [id number] ? ?PCP:  Patient, No Pcp Per (Inactive) ?  ?[facility] Providers ?Cardiologist:  Lyman Bishop MD ? ? ?Visit Details  ? ?VS:   BP 142/83, HR 72, O2 97% ? ?Wt Readings from Last 3 Encounters:  ?06/08/21 152 lb (68.9 kg)  ?02/04/21 149 lb (67.6 kg)  ?10/06/20 144 lb 8 oz (65.5 kg)  ?  ? ?Reason for visit: leqvio injection  ?Performed today: Vitals, Injection, Education ?Changes (medications, testing, etc.) : n/a ?Length of Visit: 25 minutes ? ? ? ?Medications Adjustments/Labs and Tests Ordered: ?Leqvio injection (first dose) administered in the office with supervision of [name], [facility]. SQ injection administered to posterior left arm. Patient tolerated well. Patient observed for 20 minutes before discharge.  ? ?Education provided on side effects, administration schedule.  ? ?LOT: [id number] ?[name]: [id number] ?SN: [id number] ?Exp: March 2025 ? ? ?Signed, ?Fidel Levy, RN  ?02/15/2022 11:46 AM  ?

## 2022-03-08 ENCOUNTER — Other Ambulatory Visit: Payer: Self-pay

## 2022-03-09 ENCOUNTER — Encounter: Payer: Self-pay | Admitting: Cardiology

## 2022-03-09 ENCOUNTER — Ambulatory Visit: Payer: Medicare PPO | Admitting: Cardiology

## 2022-03-09 VITALS — BP 120/84 | HR 76 | Ht 68.0 in | Wt 156.0 lb

## 2022-03-09 DIAGNOSIS — K219 Gastro-esophageal reflux disease without esophagitis: Secondary | ICD-10-CM | POA: Diagnosis not present

## 2022-03-09 DIAGNOSIS — I10 Essential (primary) hypertension: Secondary | ICD-10-CM | POA: Diagnosis not present

## 2022-03-09 DIAGNOSIS — E785 Hyperlipidemia, unspecified: Secondary | ICD-10-CM

## 2022-03-09 NOTE — Patient Instructions (Signed)

## 2022-03-09 NOTE — Progress Notes (Signed)
?Cardiology Office Note:   ? ?Date:  03/09/2022  ? ?ID:  Rebecca Montgomery, DOB 1955-12-02, MRN [id number] ? ?PCP:  Patient, No Pcp Per (Inactive)  ?Cardiologist:  Jenne Campus, MD   ? ?Referring MD: No ref. provider found  ? ?Chief Complaint  ?Patient presents with  ? Follow-up  ?Doing very well ? ?History of Present Illness:   ? ?Rebecca Montgomery is a 66 y.o. female with past medical history significant for essential hypertension, dyslipidemia with intolerance to multiple statin, finally she is on an injectable medication seems to be tolerating this quite well.  Still very active denies have any chest pain tightness squeezing pressure burning chest, spent few months in the winter in [place] with her daughter was able to walk on trails and doing very well doing that.  No exertional chest pain tightness squeezing pressure burning chest. ? ?Past Medical History:  ?Diagnosis Date  ? Acid reflux   ? Acute conjunctivitis of left eye 02/24/2021  ? Atypical chest pain 12/27/2018  ? Back pain   ? Basal cell adenocarcinoma   ? Body mass index (BMI) of 23.0 to 23.9 in adult 01/26/2022  ? Last Assessment & Plan:  Formatting of this note might be different from the original. BMI Assessment: Current Body mass index is 23.02 kg/m?Marland Kitchen  Patient BMI currently is in the acceptable range (>=18.5 and < 25 kg/m2) . Current barriers to healthy weight management include none.  BMI Plan:  Today, Charde and I have discussed methods to help address her current weight.   No new intervention is indica  ? Cellulitis of left lower extremity 01/26/2020  ? Last Assessment & Plan:  Formatting of this note might be different from the original. Mild swelling and pain on examination. No drainage. 2 other small areas just on each side of the original area.  Will treat with antibiotic. She is to notify if not improving.  ? Cervical cancer screening 01/24/2021  ? Last Assessment & Plan:  Formatting of this note might be different from the original. She requested  deference of gyn exam with pap today due to right hand fx. She will call to reschedule this at a later date.  ? Cervical spondylolysis 10/06/2020  ? Closed head injury 01/12/2022  ? Concussion 12/2000  ? Dyslipidemia 12/27/2018  ? Familial hyperlipidemia 07/04/2018  ? Last Assessment & Plan:  Formatting of this note is different from the original. Pertinent Data:   Current medication includes: diet modification and exercise This patient does not have an active medication from one of the medication groupers..  Last Lipid Values Cholesterol (mg/dl)  Date Value  04/29/2019 271 (H)  07/04/2018 306 (H)   Triglycerides (mg/dl)  Date Value  04/29/2019 214 (H)  08/22/2  ? Flu-like symptoms 02/24/2021  ? GERD (gastroesophageal reflux disease) 04/03/2018  ? Last Assessment & Plan:  Formatting of this note might be different from the original. Assessment Chronic and stable No compliance issue noted with today visit  Plan Continue current treatment regimen Reviewed risk of GERD Reviewed principals of treatment Patient medication Prilosec will be continued  Last Assessment & Plan:  Formatting of this note might be different from the original. Problem co  ? H/O total hysterectomy 08/09/2016  ? Headache   ? History of anaphylaxis 01/20/2015  ? History of skin cancer 10/03/2018  ? Last Assessment & Plan:  Formatting of this note might be different from the original. 2 small area of concern, on forehead and to the  left of the nose on the bridge. No drainage. Red, irritated skin. Will refer to dermatology for further evaluation  ? 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 Formatting of this note might be different from the original. 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 Formatting of this note might be different from the original. 3/17- genital wound culture= HSV-2  ? Hypercholesteremia   ? Hyperlipidemia 07/04/2018  ? Last Assessment & Plan:  Formatting of this note is different from the original.  Problem Complexity:  Chronic problem/illness, stable  Pertinent Data: LDL Calculated (mg/dL)  Date Value  01/09/2020 223 (H)   Cholesterol (mg/dl)  Date Value  01/09/2020 310 (H)   HDL (mg/dl)  Date Value  01/09/2020 56.5   Triglycerides (mg/dl)  Date Value  01/09/2020 151 (H)   AST (U/L)  Date Value  01/09/2020 16     ? Long term use of drug 05/01/2019  ? Last Assessment & Plan:  Formatting of this note might be different from the original. We reviewed age, sex, and risk factor related health maintenance interventions, largely based on the current USPSTF grade A and B recommendations.these included High blood pressure screening, diet and exercise counseling and immunization review and recommendations.  ? Neck pain   ? Nonintractable headache 08/05/2018  ? Osteoporosis   ? Palpitations 12/27/2018  ? Paresthesia 10/06/2020  ? Postmenopausal state 12/29/2021  ? Screen for colon cancer 07/06/2018  ? Last Assessment & Plan:  Formatting of this note might be different from the original. FOBT cards given  ? Serum sickness 05/06/2020  ? Last Assessment & Plan:  Formatting of this note might be different from the original. She is doing very well and is followed closely with [facility]  Last Assessment & Plan:  Formatting of this note might be different from the original. Doing well and follows with [facility].  ? Sore throat 02/24/2021  ? Suspected 2019 novel coronavirus infection 02/24/2021  ? Tinnitus 01/12/2022  ? Vaccine counseling 01/26/2022  ? Last Assessment & Plan:  Formatting of this note might be different from the original. Advised to get the pneumonia vaccine which is recommended by the CDC's ACIP. Advised to get the Shingrix vaccine (shingles) which is also recommended by the CDC's ACIP, which may or may not be covered by her Medicare part D plan and which we do not stock at this office given the uncertainty regarding reimburseme  ? Vertigo 01/12/2022  ? Vitamin D deficiency 01/12/2022  ? Vocal cord dysfunction  09/16/2013  ? ? ?Past Surgical History:  ?Procedure Laterality Date  ? ABDOMINAL HYSTERECTOMY    ? EYE SURGERY Right   ? HERNIA REPAIR    ? KNEE SURGERY Right   ? WRIST SURGERY Right   ? ? ?Current Medications: ?Current Meds  ?Medication Sig  ? acyclovir (ZOVIRAX) 400 MG tablet Take 1 tablet by mouth as needed.  ? aspirin 81 MG tablet Take 81 mg by mouth daily.  ? EPINEPHrine 0.3 mg/0.3 mL IJ SOAJ injection INJECT CONTENTS OF 1 PEN INTO OUTER THIGH FOR SEVERE ALLERGIC REACTION. CALL 911 AFTER USE.  ? EPINEPHrine 0.3 mg/0.3 mL IJ SOAJ injection Inject 0.3 mg into the muscle as needed for anaphylaxis.  ? ezetimibe (ZETIA) 10 MG tablet Take 1 tablet by mouth once daily (Patient taking differently: Take 10 mg by mouth daily.)  ? predniSONE (DELTASONE) 50 MG tablet Take 50 mg by mouth as needed.   ? PRESCRIPTION MEDICATION  Inject 1 each into the skin every 6 (six) months.  ?  ? ?Allergies:   Bee venom, Benzonatate, Cellulose, Covid-19 (mrna) vaccine, Lidocaine, Peppermint oil, Shellfish allergy, Famotidine, Omeprazole, Atorvastatin, Repatha [evolocumab], and Rosuvastatin  ? ?Social History  ? ?Socioeconomic History  ? Marital status: Divorced  ?  Spouse name: Not on file  ? Number of children: 2  ? Years of education: masters  ? Highest education level: Not on file  ?Occupational History  ? Occupation: Retired  ?Tobacco Use  ? Smoking status: Never  ? Smokeless tobacco: Never  ?Vaping Use  ? Vaping Use: Never used  ?Substance and Sexual Activity  ? Alcohol use: Never  ? Drug use: Never  ? Sexual activity: Not on file  ?Other Topics Concern  ? Not on file  ?Social History Narrative  ? Lives at home alone.  ? Right-handed.  ? Glass of tea in the morning.   ? ?Social Determinants of Health  ? ?Financial Resource Strain: Not on file  ?Food Insecurity: Not on file  ?Transportation Needs: Not on file  ?Physical Activity: Not on file  ?Stress: Not on file  ?Social Connections: Not on file  ?  ? ?Family History: ?The patient's  family history includes Congestive Heart Failure in her father; Dementia in her mother; Diabetes in her sister and sister; Heart defect in her daughter; Heart failure in her mother; Hyperlipidemia in her

## 2022-03-23 NOTE — Telephone Encounter (Signed)
Spoke with [employer] [facility] -- patient approved ?Provided verbal prescription for leqvio to pharmacist with [facility] [employer] ?They will call when shipment is ready ?

## 2022-03-30 ENCOUNTER — Other Ambulatory Visit: Payer: Self-pay | Admitting: Internal Medicine

## 2022-04-12 NOTE — Telephone Encounter (Signed)
Called [employer] [facility] to check on status of Leqvio  She is due for next dose in July  Spent 30+ minutes on the phone to check on status of shipment Was notified would ship next week

## 2022-06-14 ENCOUNTER — Ambulatory Visit: Payer: Medicare PPO | Admitting: Emergency Medicine

## 2022-06-14 ENCOUNTER — Encounter: Payer: Self-pay | Admitting: Emergency Medicine

## 2022-06-14 VITALS — BP 136/74 | HR 69 | Temp 98.0°F | Ht 68.0 in | Wt 149.2 lb

## 2022-06-14 DIAGNOSIS — I10 Essential (primary) hypertension: Secondary | ICD-10-CM | POA: Diagnosis not present

## 2022-06-14 DIAGNOSIS — K219 Gastro-esophageal reflux disease without esophagitis: Secondary | ICD-10-CM | POA: Diagnosis not present

## 2022-06-14 DIAGNOSIS — Z1211 Encounter for screening for malignant neoplasm of colon: Secondary | ICD-10-CM

## 2022-06-14 DIAGNOSIS — Z7689 Persons encountering health services in other specified circumstances: Secondary | ICD-10-CM

## 2022-06-14 DIAGNOSIS — E785 Hyperlipidemia, unspecified: Secondary | ICD-10-CM

## 2022-06-14 DIAGNOSIS — Z1231 Encounter for screening mammogram for malignant neoplasm of breast: Secondary | ICD-10-CM

## 2022-06-14 NOTE — Patient Instructions (Signed)
Health Maintenance After Age 65 After age 65, you are at a higher risk for certain long-term diseases and infections as well as injuries from falls. Falls are a major cause of broken bones and head injuries in people who are older than age 65. Getting regular preventive care can help to keep you healthy and well. Preventive care includes getting regular testing and making lifestyle changes as recommended by your health care provider. Talk with your health care provider about: Which screenings and tests you should have. A screening is a test that checks for a disease when you have no symptoms. A diet and exercise plan that is right for you. What should I know about screenings and tests to prevent falls? Screening and testing are the best ways to find a health problem early. Early diagnosis and treatment give you the best chance of managing medical conditions that are common after age 65. Certain conditions and lifestyle choices may make you more likely to have a fall. Your health care provider may recommend: Regular vision checks. Poor vision and conditions such as cataracts can make you more likely to have a fall. If you wear glasses, make sure to get your prescription updated if your vision changes. Medicine review. Work with your health care provider to regularly review all of the medicines you are taking, including over-the-counter medicines. Ask your health care provider about any side effects that may make you more likely to have a fall. Tell your health care provider if any medicines that you take make you feel dizzy or sleepy. Strength and balance checks. Your health care provider may recommend certain tests to check your strength and balance while standing, walking, or changing positions. Foot health exam. Foot pain and numbness, as well as not wearing proper footwear, can make you more likely to have a fall. Screenings, including: Osteoporosis screening. Osteoporosis is a condition that causes  the bones to get weaker and break more easily. Blood pressure screening. Blood pressure changes and medicines to control blood pressure can make you feel dizzy. Depression screening. You may be more likely to have a fall if you have a fear of falling, feel depressed, or feel unable to do activities that you used to do. Alcohol use screening. Using too much alcohol can affect your balance and may make you more likely to have a fall. Follow these instructions at home: Lifestyle Do not drink alcohol if: Your health care provider tells you not to drink. If you drink alcohol: Limit how much you have to: 0-1 drink a day for women. 0-2 drinks a day for men. Know how much alcohol is in your drink. In the [place] one drink equals one 12 oz bottle of beer (355 mL), one 5 oz glass of wine (148 mL), or one 1 oz glass of hard liquor (44 mL). Do not use any products that contain nicotine or tobacco. These products include cigarettes, chewing tobacco, and vaping devices, such as e-cigarettes. If you need help quitting, ask your health care provider. Activity  Follow a regular exercise program to stay fit. This will help you maintain your balance. Ask your health care provider what types of exercise are appropriate for you. If you need a cane or walker, use it as recommended by your health care provider. Wear supportive shoes that have nonskid soles. Safety  Remove any tripping hazards, such as rugs, cords, and clutter. Install safety equipment such as grab bars in bathrooms and safety rails on stairs. Keep rooms and walkways   well-lit. General instructions Talk with your health care provider about your risks for falling. Tell your health care provider if: You fall. Be sure to tell your health care provider about all falls, even ones that seem minor. You feel dizzy, tiredness (fatigue), or off-balance. Take over-the-counter and prescription medicines only as told by your health care provider. These include  supplements. Eat a healthy diet and maintain a healthy weight. A healthy diet includes low-fat dairy products, low-fat (lean) meats, and fiber from whole grains, beans, and lots of fruits and vegetables. Stay current with your vaccines. Schedule regular health, dental, and eye exams. Summary Having a healthy lifestyle and getting preventive care can help to protect your health and wellness after age 65. Screening and testing are the best way to find a health problem early and help you avoid having a fall. Early diagnosis and treatment give you the best chance for managing medical conditions that are more common for people who are older than age 65. Falls are a major cause of broken bones and head injuries in people who are older than age 65. Take precautions to prevent a fall at home. Work with your health care provider to learn what changes you can make to improve your health and wellness and to prevent falls. This information is not intended to replace advice given to you by your health care provider. Make sure you discuss any questions you have with your health care provider. Document Revised: 03/21/2021 Document Reviewed: 03/21/2021 Elsevier Patient Education  2023 [employer]  

## 2022-06-14 NOTE — Assessment & Plan Note (Signed)
Well-controlled hypertension off medications at present time. BP Readings from Last 3 Encounters:  06/14/22 136/74  03/09/22 120/84  02/15/22 (!) 142/83  Dietary approaches to stop hypertension discussed.

## 2022-06-14 NOTE — Progress Notes (Signed)
Rebecca Montgomery 66 y.o.   Chief Complaint  Patient presents with   New Patient (Initial Visit)    No concerns     HISTORY OF PRESENT ILLNESS: This is a 66 y.o. female first visit to this office, here to establish care with me. Has history of hypertension and dyslipidemia, statin intolerant. 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 Non-smoker.  Physically active.  Good nutrition.  HPI   Prior to Admission medications   Medication Sig Start Date End Date Taking? Authorizing Provider  acyclovir (ZOVIRAX) 400 MG tablet Take 1 tablet by mouth as needed. 08/23/20   [provider]  aspirin 81 MG tablet Take 81 mg by mouth daily.    [provider]  EPINEPHrine 0.3 mg/0.3 mL IJ SOAJ injection INJECT CONTENTS OF 1 PEN INTO OUTER THIGH FOR SEVERE ALLERGIC REACTION. CALL 911 AFTER USE. 07/25/18   [provider]  EPINEPHrine 0.3 mg/0.3 mL IJ SOAJ injection Inject 0.3 mg into the muscle as needed for anaphylaxis. 06/05/16   [provider]  ezetimibe (ZETIA) 10 MG tablet Take 1 tablet by mouth once daily 03/30/22   [name], Nadean Corwin, MD  predniSONE (DELTASONE) 50 MG tablet Take 50 mg by mouth as needed.     [provider]  PRESCRIPTION MEDICATION Inject 1 each into the skin every 6 (six) months.    [provider]    Allergies  Allergen Reactions   Bee Venom Other (See Comments)    Serum sickness reported with insect bite. Has been treated with prednisone only in the past per patient.  Also, fire ants.  Anaphylaxis reaction.   Benzonatate Other (See Comments)    Cannot take due due to all "caine" allergies   Cellulose Shortness Of Breath   Covid-19 (Mrna) Vaccine Anaphylaxis   Lidocaine Anaphylaxis    ALL CAINES PER PATIENT.   Peppermint Oil Anaphylaxis   Shellfish Allergy Anaphylaxis and Other (See Comments)    Pt Doesn't remember   Famotidine    Omeprazole    Atorvastatin Other (See Comments)     Dementia type symptoms, pain    Repatha [Evolocumab] Hives and Itching   Rosuvastatin Other (See Comments)    Dementia type symptoms, pain     Patient Active Problem List   Diagnosis Date Noted   Essential hypertension 03/09/2022   Body mass index (BMI) of 23.0 to 23.9 in adult 01/26/2022   Vitamin D deficiency 01/12/2022   Secondary hypertension 01/12/2022   Postmenopausal state 12/29/2021   Suspected 2019 novel coronavirus infection 02/24/2021   Osteoporosis    Basal cell adenocarcinoma    Acid reflux    Cervical spondylolysis 10/06/2020   Dyslipidemia 12/27/2018   History of skin cancer 10/03/2018   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 BMI Assessment: Current Body mass index is 23.02 kg/m.  Patient  BMI currently is in the acceptable range (>=18.5 and < 25 kg/m2) . Current barriers to healthy weight management include none.  BMI Plan:  Today, Rebecca Montgomery and I have discussed methods to help address her current weight.   No new intervention is indica   Cellulitis of left lower extremity 01/26/2020   Last Assessment & Plan:  Formatting of this note might be different from the original. Mild swelling and pain on examination. No drainage. 2 other small areas just on each side of the original area.  Will treat with antibiotic. She is to notify if not improving.   Cervical cancer screening 01/24/2021   Last  Assessment & Plan:  Formatting of this note might be different from the original. She requested deference of gyn exam with pap today due to right hand fx. She will call to reschedule this at a later date.   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 Pertinent Data:   Current medication includes: diet modification and exercise This patient does not have an active medication from one of the medication groupers..  Last Lipid Values Cholesterol (mg/dl)  Date Value  04/29/2019 271 (H)  07/04/2018 306 (H)   Triglycerides (mg/dl)  Date Value  04/29/2019 214 (H)  08/22/2   Flu-like symptoms 02/24/2021   GERD (gastroesophageal reflux disease) 04/03/2018   Last Assessment & Plan:  Formatting of this note might be different from the original. Assessment Chronic and stable No compliance issue noted with today visit  Plan Continue current treatment regimen Reviewed risk of GERD Reviewed principals of treatment Patient medication Prilosec will be continued  Last Assessment & Plan:  Formatting of this note might be different from the original. Problem co   H/O total hysterectomy 08/09/2016   Headache    History of anaphylaxis 01/20/2015   History of skin cancer 10/03/2018   Last Assessment & Plan:  Formatting of this note might be different from the original. 2 small area of concern, on forehead and to the left of the nose on the bridge. No drainage. Red, irritated skin. 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 Formatting of this note might be different from the original. 3/17- genital wound culture= HSV-2   Hypercholesteremia    Hyperlipidemia  07/04/2018   Last Assessment & Plan:  Formatting of this note is different from the original. Problem Complexity:  Chronic problem/illness, stable  Pertinent Data: LDL Calculated (mg/dL)  Date Value  01/09/2020 223 (H)   Cholesterol (mg/dl)  Date Value  01/09/2020 310 (H)   HDL (mg/dl)  Date Value  01/09/2020 56.5   Triglycerides (mg/dl)  Date Value  01/09/2020 151 (H)   AST (U/L)  Date Value  01/09/2020 16      Long term use of drug 05/01/2019   Last Assessment & Plan:  Formatting of this note might be different from the original. We reviewed age, sex, and risk factor related health maintenance interventions, largely based on the current USPSTF grade A and B recommendations.these included High blood pressure screening, diet and exercise counseling and immunization review and recommendations.   Neck pain    Nonintractable headache 08/05/2018  Osteoporosis    Palpitations 12/27/2018   Paresthesia 10/06/2020   Postmenopausal state 12/29/2021   Screen for colon cancer 07/06/2018   Last Assessment & Plan:  Formatting of this note might be different from the original. FOBT cards given   Serum sickness 05/06/2020   Last Assessment & Plan:  Formatting of this note might be different from the original. She is doing very well and is followed closely with [facility]  Last Assessment & Plan:  Formatting of this note might be different from the original. Doing well and follows with [facility].   Sore throat 02/24/2021   Suspected 2019 novel coronavirus infection 02/24/2021   Tinnitus 01/12/2022   Vaccine counseling 01/26/2022   Last Assessment & Plan:  Formatting of this note might be different from the original. Advised to get the pneumonia vaccine which is recommended by the CDC's ACIP. Advised to get the Shingrix vaccine (shingles) which is also recommended by the CDC's ACIP, which may or may not be covered by her Medicare part D plan and which we do not stock at this office given the uncertainty  regarding reimburseme   Vertigo 01/12/2022   Vitamin D deficiency 01/12/2022   Vocal cord dysfunction 09/16/2013    Past Surgical History:  Procedure Laterality Date   ABDOMINAL HYSTERECTOMY     EYE SURGERY Right    HERNIA REPAIR     KNEE SURGERY Right    WRIST SURGERY Right     Social History   Socioeconomic History   Marital status: Divorced    Spouse name: Not on file   Number of children: 2   Years of education: masters   Highest education level: Not on file  Occupational History   Occupation: Retired  Tobacco Use   Smoking status: Never   Smokeless tobacco: Never  Vaping Use   Vaping Use: Never used  Substance and Sexual Activity   Alcohol use: Never   Drug use: Never   Sexual activity: Not on file  Other Topics Concern   Not on file  Social History Narrative   Lives at home alone.   Right-handed.   Glass of tea in the morning.    Social Determinants of Health   Financial Resource Strain: Not on file  Food Insecurity: Not on file  Transportation Needs: Not on file  Physical Activity: Sufficiently Active (12/21/2020)   Exercise Vital Sign    Days of Exercise per Week: 5 days    Minutes of Exercise per Session: 150+ min  Stress: Stress Concern Present (11/23/2020)   [place]    Feeling of Stress : Rather much  Social Connections: Not on file  Intimate Partner Violence: Not on file    Family History  Problem Relation Age of Onset   Dementia Mother        heavy smoker   Heart failure Mother    Congestive Heart Failure Father    Diabetes Sister    Hyperlipidemia Sister    Stroke Brother    Hyperlipidemia Brother    Diabetes Sister    Hyperlipidemia Sister    Stroke Brother    Hyperlipidemia Brother    Heart defect Daughter      Review of Systems  Constitutional: Negative.  Negative for chills and fever.  HENT: Negative.  Negative for congestion and sore throat.   Respiratory:  Negative.  Negative for cough and shortness of breath.   Cardiovascular: Negative.  Negative for chest  pain and palpitations.  Gastrointestinal:  Negative for abdominal pain, diarrhea, nausea and vomiting.  Genitourinary: Negative.   Skin: Negative.  Negative for rash.  Neurological: Negative.  Negative for dizziness and headaches.  All other systems reviewed and are negative.  Today's Vitals   06/14/22 1304  BP: 136/74  Pulse: 69  Temp: 98 F (36.7 C)  TempSrc: Oral  SpO2: 97%  Weight: 149 lb 4 oz (67.7 kg)  Height: '5\' 8"'$  (1.727 m)   Body mass index is 22.69 kg/m.   Physical Exam Vitals reviewed.  Constitutional:      Appearance: Normal appearance.  HENT:     Head: Normocephalic.     Mouth/Throat:     Mouth: Mucous membranes are moist.     Pharynx: Oropharynx is clear.  Eyes:     Extraocular Movements: Extraocular movements intact.     Conjunctiva/sclera: Conjunctivae normal.     Pupils: Pupils are equal, round, and reactive to light.  Cardiovascular:     Rate and Rhythm: Normal rate and regular rhythm.     Pulses: Normal pulses.     Heart sounds: Normal heart sounds.  Pulmonary:     Effort: Pulmonary effort is normal.     Breath sounds: Normal breath sounds.  Musculoskeletal:     Cervical back: No tenderness.     Right lower leg: No edema.     Left lower leg: No edema.  Lymphadenopathy:     Cervical: No cervical adenopathy.  Skin:    General: Skin is warm and dry.     Capillary Refill: Capillary refill takes less than 2 seconds.  Neurological:     General: No focal deficit present.     Mental Status: She is alert and oriented to person, place, and time.  Psychiatric:        Mood and Affect: Mood normal.        Behavior: Behavior normal.      ASSESSMENT & PLAN: A total of 47 minutes was spent with the patient and counseling/coordination of care regarding preparing for this visit, review of available medical records, comprehensive history and physical  examination, review of multiple chronic medical problems and their management, review of all medications, education on nutrition, prognosis, documentation and need for follow-up.  Problem List Items Addressed This Visit       Cardiovascular and Mediastinum   Essential hypertension - Primary    Well-controlled hypertension off medications at present time. BP Readings from Last 3 Encounters:  06/14/22 136/74  03/09/22 120/84  02/15/22 (!) 142/83  Dietary approaches to stop hypertension discussed.         Digestive   GERD (gastroesophageal reflux disease)    Stable.  No clinical concerns.        Other   Dyslipidemia    Intolerant to statins.  On Zetia 10 mg daily Mostly at triglycerides problems. Advised to decrease amount of daily carbohydrate intake. The 10-year ASCVD risk score (Arnett DK, et al., 2019) is: 8%   Values used to calculate the score:     Age: 61 years     Sex: Female     Is Non-Hispanic African American: No     Diabetic: No     Tobacco smoker: No     Systolic Blood Pressure: 194 mmHg     Is BP treated: No     HDL Cholesterol: 62 mg/dl     Total Cholesterol: 297 mg/dl       Other Visit Diagnoses  Encounter to establish care       Encounter for screening mammogram for malignant neoplasm of breast       Relevant Orders   MM Digital Screening   Colon cancer screening       Relevant Orders   Ambulatory referral to Gastroenterology        Patient Instructions  Health Maintenance After Age 76 After age 88, you are at a higher risk for certain long-term diseases and infections as well as injuries from falls. Falls are a major cause of broken bones and head injuries in people who are older than age 72. Getting regular preventive care can help to keep you healthy and well. Preventive care includes getting regular testing and making lifestyle changes as recommended by your health care provider. Talk with your health care provider about: Which  screenings and tests you should have. A screening is a test that checks for a disease when you have no symptoms. A diet and exercise plan that is right for you. What should I know about screenings and tests to prevent falls? Screening and testing are the best ways to find a health problem early. Early diagnosis and treatment give you the best chance of managing medical conditions that are common after age 7. Certain conditions and lifestyle choices may make you more likely to have a fall. Your health care provider may recommend: Regular vision checks. Poor vision and conditions such as cataracts can make you more likely to have a fall. If you wear glasses, make sure to get your prescription updated if your vision changes. Medicine review. Work with your health care provider to regularly review all of the medicines you are taking, including over-the-counter medicines. Ask your health care provider about any side effects that may make you more likely to have a fall. Tell your health care provider if any medicines that you take make you feel dizzy or sleepy. Strength and balance checks. Your health care provider may recommend certain tests to check your strength and balance while standing, walking, or changing positions. Foot health exam. Foot pain and numbness, as well as not wearing proper footwear, can make you more likely to have a fall. Screenings, including: Osteoporosis screening. Osteoporosis is a condition that causes the bones to get weaker and break more easily. Blood pressure screening. Blood pressure changes and medicines to control blood pressure can make you feel dizzy. Depression screening. You may be more likely to have a fall if you have a fear of falling, feel depressed, or feel unable to do activities that you used to do. Alcohol use screening. Using too much alcohol can affect your balance and may make you more likely to have a fall. Follow these instructions at home: Lifestyle Do  not drink alcohol if: Your health care provider tells you not to drink. If you drink alcohol: Limit how much you have to: 0-1 drink a day for women. 0-2 drinks a day for men. Know how much alcohol is in your drink. In the [place] one drink equals one 12 oz bottle of beer (355 mL), one 5 oz glass of wine (148 mL), or one 1 oz glass of hard liquor (44 mL). Do not use any products that contain nicotine or tobacco. These products include cigarettes, chewing tobacco, and vaping devices, such as e-cigarettes. If you need help quitting, ask your health care provider. Activity  Follow a regular exercise program to stay fit. This will help you maintain your balance. Ask your health care  provider what types of exercise are appropriate for you. If you need a cane or walker, use it as recommended by your health care provider. Wear supportive shoes that have nonskid soles. Safety  Remove any tripping hazards, such as rugs, cords, and clutter. Install safety equipment such as grab bars in bathrooms and safety rails on stairs. Keep rooms and walkways well-lit. General instructions Talk with your health care provider about your risks for falling. Tell your health care provider if: You fall. Be sure to tell your health care provider about all falls, even ones that seem minor. You feel dizzy, tiredness (fatigue), or off-balance. Take over-the-counter and prescription medicines only as told by your health care provider. These include supplements. Eat a healthy diet and maintain a healthy weight. A healthy diet includes low-fat dairy products, low-fat (lean) meats, and fiber from whole grains, beans, and lots of fruits and vegetables. Stay current with your vaccines. Schedule regular health, dental, and eye exams. Summary Having a healthy lifestyle and getting preventive care can help to protect your health and wellness after age 100. Screening and testing are the best way to find a health problem early and  help you avoid having a fall. Early diagnosis and treatment give you the best chance for managing medical conditions that are more common for people who are older than age 10. Falls are a major cause of broken bones and head injuries in people who are older than age 51. Take precautions to prevent a fall at home. Work with your health care provider to learn what changes you can make to improve your health and wellness and to prevent falls. This information is not intended to replace advice given to you by your health care provider. Make sure you discuss any questions you have with your health care provider. Document Revised: 03/21/2021 Document Reviewed: 03/21/2021 Elsevier Patient Education  [place], MD [facility]

## 2022-06-14 NOTE — Assessment & Plan Note (Signed)
Stable.  No clinical concerns.

## 2022-06-14 NOTE — Assessment & Plan Note (Signed)
Intolerant to statins.  On Zetia 10 mg daily Mostly at triglycerides problems. Advised to decrease amount of daily carbohydrate intake. The 10-year ASCVD risk score (Arnett DK, et al., 2019) is: 8%   Values used to calculate the score:     Age: 66 years     Sex: Female     Is Non-Hispanic African American: No     Diabetic: No     Tobacco smoker: No     Systolic Blood Pressure: 782 mmHg     Is BP treated: No     HDL Cholesterol: 62 mg/dl     Total Cholesterol: 297 mg/dl

## 2022-06-29 ENCOUNTER — Encounter: Payer: Self-pay | Admitting: Pharmacist

## 2022-06-29 ENCOUNTER — Ambulatory Visit: Payer: Medicare PPO | Admitting: Pharmacist

## 2022-06-29 VITALS — BP 150/83 | Wt 151.6 lb

## 2022-06-29 DIAGNOSIS — E7849 Other hyperlipidemia: Secondary | ICD-10-CM

## 2022-06-29 NOTE — Progress Notes (Signed)
   PharmD Visit   Date of Encounter: 06/29/2022 ID: Rebecca Montgomery, DOB 03/04/1956, MRN [id number]  PCP:  Horald Pollen, MD   [facility] Providers Cardiologist:  None      Visit Details   VS:  There were no vitals taken for this visit. , BMI There is no height or weight on file to calculate BMI.  Wt Readings from Last 3 Encounters:  06/14/22 149 lb 4 oz (67.7 kg)  03/09/22 156 lb (70.8 kg)  06/08/21 152 lb (68.9 kg)     Reason for visit: Leqvio Injection Performed today: Vitals, Injection, Education Changes (medications, testing, etc.) : Leqvio 284 mg given SQ back right arm Length of Visit: 10 minutes    Medications Adjustments/Labs and Tests Ordered: No orders of the defined types were placed in this encounter.  No orders of the defined types were placed in this encounter.    Alvy Bimler, [facility]  06/29/2022 8:55 AM

## 2022-07-04 MED ORDER — EZETIMIBE 10 MG PO TABS: 10.0000 mg | ORAL_TABLET | Freq: Every day | ORAL | 0 refills | Status: DC

## 2022-07-12 ENCOUNTER — Ambulatory Visit: Payer: Medicare PPO | Admitting: Internal Medicine

## 2022-08-11 ENCOUNTER — Encounter: Payer: Self-pay | Admitting: Internal Medicine

## 2022-08-16 LAB — LIPID PANEL
Chol/HDL Ratio: 2.2 ratio (ref 0.0–4.4)
Cholesterol, Total: 154 mg/dL (ref 100–199)
HDL: 70 mg/dL (ref >39)
LDL Chol Calc (NIH): 61 mg/dL (ref 0–99)
Triglycerides: 134 mg/dL (ref 0–149)
VLDL Cholesterol Cal: 23 mg/dL (ref 5–40)

## 2022-08-30 ENCOUNTER — Ambulatory Visit: Payer: Medicare PPO | Attending: Internal Medicine | Admitting: Internal Medicine

## 2022-08-30 ENCOUNTER — Encounter: Payer: Self-pay | Admitting: Internal Medicine

## 2022-08-30 VITALS — BP 126/70 | HR 78 | Ht 68.0 in | Wt 154.6 lb

## 2022-08-30 DIAGNOSIS — E7849 Other hyperlipidemia: Secondary | ICD-10-CM

## 2022-08-30 DIAGNOSIS — I10 Essential (primary) hypertension: Secondary | ICD-10-CM | POA: Diagnosis not present

## 2022-08-30 DIAGNOSIS — Z789 Other specified health status: Secondary | ICD-10-CM | POA: Diagnosis not present

## 2022-08-30 NOTE — Progress Notes (Signed)
LIPID CLINIC CONSULT NOTE  Chief Complaint:  Manage dyslipidemia  Primary Care Physician: Horald Pollen, MD  Primary Cardiologist:  None  HPI:  Rebecca Montgomery is a 66 y.o. female who is being seen today for the evaluation of dyslipidemia at the request of Horald Pollen, *.  This is a pleasant 66 year old female with longstanding history of dyslipidemia probably with the notable elevation in her cholesterol since she was told in her 73s.  She has a strong family history of heart disease and stroke including 2 brothers who had strokes in their 74s and 63s.  There is also family history of high triglycerides although her triglycerides now are better controlled but has been as high as 294 in the past.  Findings are highly concerning for familial hyperlipidemia or possibly familial combined hyperlipidemia.  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  She has had some worsening memory difficulty/memory fog on statins in the past and has been tried on several including atorvastatin, rosuvastatin and others.  She is averse to taking a statin medication.  Her most recent lipid showed total cholesterol 310, triglycerides 151, HDL 56 and LDL of 223.  She reports a very heart healthy diet in fact nearly vegan diet.  Her weight generally is around 120 pounds however recently she had a MRSA infection as well as an anaphylactic reaction to the modality vaccine which took several weeks to recover from.  She also has a history of anaphylaxis and serum sickness and allergies to shellfish and bee venom as well as fire ants.  09/23/2020  [name] returns today for follow-up.  Her lipids are improved despite not being on therapy.  She is made dietary changes, more physical activity and other things and has come off of some steroid medications that she had been taking more regularly.  All of this may have contributed to improvement in her lipids with the most recent total  cholesterol 248, triglycerides 129, HDL 63 and LDL 162.  Overall this is significant improvement however I suspect cannot be much lower as she is maximized her lifestyle modifications.  Because of her family history of stroke I would try to target LDL lower ideally at 100 if we can achieve that.  We discussed several other options for possible therapies.  06/08/2021  [name] returns today for follow-up.  She is noted she has had a lot of challenges recently with her overall health and family issues as well as some issues with her home that has not allowed her to eat normally.  Despite this and some weight gain with steroids, her cholesterol is lower.  Now total cholesterol 234, triglycerides 263, HDL 45 and LDL 141, down from 162.  She is taking ezetimibe which was falsely listed as causing anaphylaxis.  This is been removed from her allergies.  Although her numbers are better, still well above target.  She does have a likely FH with untreated LDL cholesterol 223.  Unfortunately she cannot tolerate the PCSK9 antibody inhibitors.  We discussed other options including Leqvio, which may be a good option for her.  08/30/2022  [name] is seen today in follow-up.  She is done extremely well on Leqvio.  She says she is tolerating it without any side effects.  Her cholesterols come down substantially.  LDL is now 61 down from 141, total cholesterol 154, triglycerides 134 and HDL 70.  She also maintains on ezetimibe.  She did want to report  that her brother unfortunately died of complications with an MI in his 87s, but he ultimately had a seizure.  She has no cardiac symptoms.  PMHx:  Past Medical History:  Diagnosis Date   Acid reflux    Acute conjunctivitis of left eye 02/24/2021   Arthritis    Atypical chest pain 12/27/2018   Back pain    Basal cell adenocarcinoma    Body mass index (BMI) of 23.0 to 23.9 in adult 01/26/2022   Last Assessment & Plan:  Formatting of this note might be different from  the original. BMI Assessment: Current Body mass index is 23.02 kg/m.  Patient BMI currently is in the acceptable range (>=18.5 and < 25 kg/m2) . Current barriers to healthy weight management include none.  BMI Plan:  Today, Kizzi and I have discussed methods to help address her current weight.   No new intervention is indica   Cellulitis of left lower extremity 01/26/2020   Last Assessment & Plan:  Formatting of this note might be different from the original. Mild swelling and pain on examination. No drainage. 2 other small areas just on each side of the original area.  Will treat with antibiotic. She is to notify if not improving.   Cervical cancer screening 01/24/2021   Last Assessment & Plan:  Formatting of this note might be different from the original. She requested deference of gyn exam with pap today due to right hand fx. She will call to reschedule this at a later date.   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 Pertinent Data:   Current medication includes: diet modification and exercise This patient does not have an active medication from one of the medication groupers..  Last Lipid Values Cholesterol (mg/dl)  Date Value  04/29/2019 271 (H)  07/04/2018 306 (H)   Triglycerides (mg/dl)  Date Value  04/29/2019 214 (H)  08/22/2   Flu-like symptoms 02/24/2021   GERD (gastroesophageal reflux disease) 04/03/2018   Last Assessment & Plan:  Formatting of this note might be different from the original. Assessment Chronic and stable No compliance issue noted with today visit  Plan Continue current treatment regimen Reviewed risk of GERD Reviewed principals of treatment Patient medication Prilosec will be continued  Last Assessment & Plan:  Formatting of this note might be different from the original. Problem co    H/O total hysterectomy 08/09/2016   Headache    History of anaphylaxis 01/20/2015   History of skin cancer 10/03/2018   Last Assessment & Plan:  Formatting of this note might be different from the original. 2 small area of concern, on forehead and to the left of the nose on the bridge. No drainage. Red, irritated skin. 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 Formatting of this note might be different from the original. 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 Problem Complexity:  Chronic problem/illness, stable  Pertinent Data: LDL Calculated (mg/dL)  Date Value  01/09/2020 223 (H)   Cholesterol (mg/dl)  Date Value  01/09/2020 310 (H)   HDL (mg/dl)  Date Value  01/09/2020 56.5   Triglycerides (mg/dl)  Date Value  01/09/2020 151 (H)   AST (U/L)  Date Value  01/09/2020 16      Long term use of drug 05/01/2019   Last Assessment & Plan:  Formatting of this note might be different from the original. We reviewed age, sex, and risk factor related health maintenance interventions, largely based on the current USPSTF grade A and B recommendations.these included High blood pressure screening, diet and exercise counseling and immunization review and recommendations.   Neck pain    Nonintractable headache 08/05/2018   Osteoporosis    Palpitations 12/27/2018   Paresthesia 10/06/2020   Postmenopausal state 12/29/2021   Screen for colon cancer 07/06/2018   Last Assessment & Plan:  Formatting of this note might be different from the original. FOBT cards given   Serum sickness 05/06/2020   Last Assessment & Plan:  Formatting of this note might be different from the original. She is doing very  well and is followed closely with [facility]  Last Assessment & Plan:  Formatting of this note might be different from the original. Doing well and follows with [facility].   Sore throat 02/24/2021   Suspected 2019 novel coronavirus infection 02/24/2021   Tinnitus 01/12/2022   Vaccine counseling 01/26/2022   Last Assessment & Plan:  Formatting of this note might be different from the original. Advised to get the pneumonia vaccine which is recommended by the CDC's ACIP. Advised to get the Shingrix vaccine (shingles) which is also recommended by the CDC's ACIP, which may or may not be covered by her Medicare part D plan and which we do not stock at this office given the uncertainty regarding reimburseme   Vertigo 01/12/2022   Vitamin D deficiency 01/12/2022   Vocal cord dysfunction 09/16/2013    Past Surgical History:  Procedure Laterality Date   ABDOMINAL HYSTERECTOMY     EYE SURGERY Right    HERNIA REPAIR     KNEE SURGERY Right    WRIST SURGERY Right     FAMHx:  Family History  Problem Relation Age of Onset   Dementia Mother        heavy smoker   Heart failure Mother    Congestive Heart Failure Father    Diabetes Sister    Hyperlipidemia Sister    Diabetes Sister    Hyperlipidemia Sister    Hyperlipidemia Brother    Heart attack Brother    Stroke Brother    Seizures Brother    Stroke Brother    Hyperlipidemia Brother    Heart defect Daughter     SOCHx:   reports that she has never smoked. She has never used smokeless tobacco. She reports that she does not drink alcohol and does not use drugs.  ALLERGIES:  Allergies  Allergen Reactions   Bee Venom Other (See Comments)    Serum sickness reported with insect bite. Has been treated with prednisone only in the past per patient.  Also, fire ants.  Anaphylaxis reaction.   Benzonatate Other (See Comments)    Cannot take due due to all "caine" allergies   Cellulose Shortness Of Breath   Covid-19 (Mrna)  Vaccine Anaphylaxis   Lidocaine Anaphylaxis    ALL CAINES PER PATIENT.   Peppermint Oil Anaphylaxis   Shellfish Allergy Anaphylaxis and Other (See Comments)    Pt Doesn't remember   Famotidine    Omeprazole    Atorvastatin  Other (See Comments)    Dementia type symptoms, pain    Repatha [Evolocumab] Hives and Itching   Rosuvastatin Other (See Comments)    Dementia type symptoms, pain     ROS: Pertinent items noted in HPI and remainder of comprehensive ROS otherwise negative.  HOME MEDS: Current Outpatient Medications on File Prior to Visit  Medication Sig Dispense Refill   acyclovir (ZOVIRAX) 400 MG tablet Take 1 tablet by mouth as needed.     aspirin 81 MG tablet Take 81 mg by mouth daily.     EPINEPHrine 0.3 mg/0.3 mL IJ SOAJ injection INJECT CONTENTS OF 1 PEN INTO OUTER THIGH FOR SEVERE ALLERGIC REACTION. CALL 911 AFTER USE.  1   ezetimibe (ZETIA) 10 MG tablet Take 1 tablet (10 mg total) by mouth daily. 90 tablet 0   inclisiran (LEQVIO) 284 MG/1.5ML SOSY injection Inject 284 mg into the skin once.     predniSONE (DELTASONE) 50 MG tablet Take 50 mg by mouth as needed.      PRESCRIPTION MEDICATION Inject 1 each into the skin every 6 (six) months.     No current facility-administered medications on file prior to visit.    LABS/IMAGING: No results found for this or any previous visit (from the past 48 hour(s)).  No results found.  LIPID PANEL:    Component Value Date/Time   CHOL 154 08/15/2022 1248   TRIG 134 08/15/2022 1248   HDL 70 08/15/2022 1248   CHOLHDL 2.2 08/15/2022 1248   LDLCALC 61 08/15/2022 1248    WEIGHTS: Wt Readings from Last 3 Encounters:  08/30/22 154 lb 9.6 oz (70.1 kg)  06/29/22 151 lb 9.6 oz (68.8 kg)  06/14/22 149 lb 4 oz (67.7 kg)    VITALS: BP 126/70   Pulse 78   Ht '5\' 8"'$  (1.727 m)   Wt 154 lb 9.6 oz (70.1 kg)   SpO2 99%   BMI 23.51 kg/m   EXAM: Deferred  EKG: Deferred  ASSESSMENT: Possible familial hyperlipidemia-Dutch  score 8 Statin intolerance-memory loss Family history of premature stroke  PLAN: 1.   Ms. Plummer has had a significant response to [facility] with marked improvement in her lipids.  She is tolerating this well without any significant side effects and is maintained on ezetimibe as well.  We will continue her current therapies.  Her Leqvio injections will be transitioned over to our [facility] center.  Plan follow-up with me annually or sooner as necessary with repeat lipids.  Pixie Casino, MD, [facility], [place] Director of the Advanced Lipid Disorders &  Cardiovascular Risk Reduction Clinic Diplomate of the American Board of Clinical Lipidology Attending Cardiologist  Direct Dial: [phone]  Fax: [phone]  Website:  [email or web address] 08/30/2022, 11:12 AM

## 2022-08-30 NOTE — Patient Instructions (Signed)
Medication Instructions:  CONTINUE current medications  *If you need a refill on your cardiac medications before your next appointment, please call your pharmacy*   Lab Work: FASTING lab work to check cholesterol in 1 year -- complete before next appointment   If you have labs (blood work) drawn today and your tests are completely normal, you will receive your results only by: [place] (if you have MyChart) OR A paper copy in the mail If you have any lab test that is abnormal or we need to change your treatment, we will call you to review the results.   Testing/Procedures: NONE   Follow-Up: At [facility], you and your health needs are our priority.  As part of our continuing mission to provide you with exceptional heart care, we have created designated Provider Care Teams.  These Care Teams include your primary Cardiologist (physician) and Advanced Practice Providers (APPs -  Physician Assistants and Nurse Practitioners) who all work together to provide you with the care you need, when you need it.  We recommend signing up for the patient portal called "MyChart".  Sign up information is provided on this After Visit Summary.  MyChart is used to connect with patients for Virtual Visits (Telemedicine).  Patients are able to view lab/test results, encounter notes, upcoming appointments, etc.  Non-urgent messages can be sent to your provider as well.   To learn more about what you can do with MyChart, go to [email or web address].    Your next appointment:    1 year with Dr. Debara Pickett    Subsequent Marion Downer injections will be completed at [place] at [facility] Address: [address] [place],  [place]  [address]

## 2022-09-04 ENCOUNTER — Telehealth: Payer: Self-pay | Admitting: Pharmacy Technician

## 2022-09-04 NOTE — Telephone Encounter (Addendum)
Auth Submission: APPROVED Payer: human[employer] medicare Medication & CPT/J Code(s) submitted: Leqvio (Inclisiran) [phone] Route of submission (phone, fax, portal):  Phone # [phone] Fax # [phone] Auth type: Buy/Bill Units/visits requested: x2 doses Reference number:  Approval from: 09/04/22 to 03/06/23 at [place] Patient has no deductible OOP: $4000 - patient has met $1007   Patient has been approved for PAP Approved: 03/10/22 - 11/12/22 ID: [id number]  Patient has been denied PAP for 2024 due to income increase per [place]. Patient is aware she will be responsible for remaining 20%.

## 2022-09-05 ENCOUNTER — Other Ambulatory Visit: Payer: Self-pay | Admitting: Pharmacy Technician

## 2022-09-05 ENCOUNTER — Encounter: Payer: Self-pay | Admitting: Internal Medicine

## 2022-09-05 NOTE — Telephone Encounter (Signed)
Eliezer Lofts, Noted: Thanks-a bunch.

## 2022-09-05 NOTE — Telephone Encounter (Signed)
Patient is due for injection Feb 2024 She has patient assistance for the medication now - this will need to be renewed for 2024

## 2022-09-13 ENCOUNTER — Ambulatory Visit: Payer: Medicare PPO

## 2022-09-20 ENCOUNTER — Other Ambulatory Visit: Payer: Self-pay | Admitting: Pharmacy Technician

## 2022-10-02 ENCOUNTER — Other Ambulatory Visit: Payer: Self-pay | Admitting: Internal Medicine

## 2022-10-02 MED ORDER — EZETIMIBE 10 MG PO TABS: 10.0000 mg | ORAL_TABLET | Freq: Every day | ORAL | 2 refills | Status: DC

## 2022-10-03 ENCOUNTER — Encounter: Payer: Self-pay | Admitting: Internal Medicine

## 2022-10-04 ENCOUNTER — Telehealth: Payer: Self-pay | Admitting: Internal Medicine

## 2022-10-04 NOTE — Telephone Encounter (Signed)
Opened in error

## 2022-11-09 ENCOUNTER — Telehealth: Payer: Self-pay

## 2022-11-09 NOTE — Telephone Encounter (Signed)
FAXED PATIENT APPLICATION TO [facility]  REGARDING LEQVIO

## 2022-11-21 ENCOUNTER — Encounter: Payer: Self-pay | Admitting: Internal Medicine

## 2022-11-21 ENCOUNTER — Ambulatory Visit: Payer: Medicare PPO | Admitting: Internal Medicine

## 2022-11-21 VITALS — BP 142/84 | HR 90 | Ht 68.0 in | Wt 159.0 lb

## 2022-11-21 DIAGNOSIS — K219 Gastro-esophageal reflux disease without esophagitis: Secondary | ICD-10-CM | POA: Diagnosis not present

## 2022-11-21 DIAGNOSIS — K449 Diaphragmatic hernia without obstruction or gangrene: Secondary | ICD-10-CM

## 2022-11-21 DIAGNOSIS — R1310 Dysphagia, unspecified: Secondary | ICD-10-CM | POA: Diagnosis not present

## 2022-11-21 DIAGNOSIS — Z800 Family history of malignant neoplasm of digestive organs: Secondary | ICD-10-CM

## 2022-11-21 DIAGNOSIS — Z86010 Personal history of colonic polyps: Secondary | ICD-10-CM | POA: Diagnosis not present

## 2022-11-21 NOTE — Patient Instructions (Addendum)
_______________________________________________________  If you are age 67 or older, your body mass index should be between 23-30. Your Body mass index is 24.18 kg/m. If this is out of the aforementioned range listed, please consider follow up with your Primary Care Provider. ________________________________________________________  The [place] [facility] providers would like to encourage you to use [facility] to communicate with providers for non-urgent requests or questions.  Due to long hold times on the telephone, sending your provider a message by [facility] may be a faster and more efficient way to get a response.  Please allow 48 business hours for a response.  Please remember that this is for non-urgent requests.  _______________________________________________________  Dennis Bast have been scheduled for an endoscopy and colonoscopy. Please follow the written instructions given to you at your visit today. Please pick up your prep supplies at the pharmacy within the next 1-3 days. If you use inhalers (even only as needed), please bring them with you on the day of your procedure.  Due to recent changes in healthcare laws, you may see the results of your imaging and laboratory studies on MyChart before your provider has had a chance to review them.  We understand that in some cases there may be results that are confusing or concerning to you. Not all laboratory results come back in the same time frame and the provider may be waiting for multiple results in order to interpret others.  Please give [place] 48 hours in order for your provider to thoroughly review all the results before contacting the office for clarification of your results.   CONTINUE: Pepcid '20mg'$  one tablet twice daily  Thank you for entrusting me with your care and choosing [facility].  Dr Lorenso Courier

## 2022-11-21 NOTE — Progress Notes (Signed)
Chief Complaint: GERD, colon cancer screening  HPI : 67 year old female with history of TBI, GERD, arthritis, vocal cord dysfunction, hiatal hernia, and family history of colon cancer presents with GERD and to discuss colon cancer screening.   She has been struggling with chest burning and regurgitation over the last few months. She has also been noticing bright red blood in her stools. She will also have mucous that is leaking from her rectum. Sometimes she will wear pads to catch the mucous. She started losing her voice in 09/2022. 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 She did have N&V for one week in 09/2022 when she was laying down but it got better. For her GERD, she is currently on Pepcid 20 mg BID with occasional Tums. She uses Gaviscon PRN. This regimen is not adequate to be able to keep her symptoms under control. Denies rectal pain. Last colonoscopy was in 2019 that had precancerous polyps. She could not get the name of the physician that performed. Her last EGD was also in 2019 and she is not sure what that showed. Endorses dysphagia at times. The dysphagia will settle in her chest and she will need to wait for this to go down. The dysphagia is mainly to solids and phlegm, but not to liquids. Denies ab pain. If she takes a stool softener, she will have one BM once every 1-2 days. She uses senna PRN to help with occasional constipation. She has been gaining some weight. She recently moved from [place] back to [facility]. 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 Mother, aunt, uncle, and daughter all have colon cancer. Her daughter was recently diagnosed with colon cancer in her 34s.  Wt Readings from Last 3 Encounters:  11/21/22 159 lb (72.1 kg)  08/30/22 154 lb 9.6 oz (70.1 kg)  06/29/22 151 lb 9.6 oz (68.8 kg)    Past Medical History:  Diagnosis Date   Acid reflux    Acute conjunctivitis of left eye 02/24/2021   Arthritis    Atypical chest pain 12/27/2018    Back pain    Basal cell adenocarcinoma    Body mass index (BMI) of 23.0 to 23.9 in adult 01/26/2022   Last Assessment & Plan:  Formatting of this note might be different from the original. BMI Assessment: Current Body mass index is 23.02 kg/m.  Patient BMI currently is in the acceptable range (>=18.5 and < 25 kg/m2) . Current barriers to healthy weight management include none.  BMI Plan:  Today, Quinesha and I have discussed methods to help address her current weight.   No new intervention is indica   Cellulitis of left lower extremity 01/26/2020   Last Assessment & Plan:  Formatting of this note might be different from the original. Mild swelling and pain on examination. No drainage. 2 other small areas just on each side of the original area.  Will treat with antibiotic. She is to notify if not improving.   Cervical cancer screening 01/24/2021   Last Assessment & Plan:  Formatting of this note might be different from the original. She requested deference of gyn exam with pap today due to right hand fx. She will call to reschedule this at a later date.   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  Pertinent Data:   Current medication includes: diet modification and exercise This patient does not have an active medication from one of the medication groupers..  Last Lipid Values Cholesterol (mg/dl)  Date Value  04/29/2019 271 (H)  07/04/2018 306 (H)   Triglycerides (mg/dl)  Date Value  04/29/2019 214 (H)  08/22/2   Flu-like symptoms 02/24/2021   GERD (gastroesophageal reflux disease) 04/03/2018   Last Assessment & Plan:  Formatting of this note might be different from the original. Assessment Chronic and stable No compliance issue noted with today visit  Plan Continue current treatment regimen Reviewed risk of GERD  Reviewed principals of treatment Patient medication Prilosec will be continued  Last Assessment & Plan:  Formatting of this note might be different from the original. Problem co   H/O total hysterectomy 08/09/2016   Headache    History of anaphylaxis 01/20/2015   History of skin cancer 10/03/2018   Last Assessment & Plan:  Formatting of this note might be different from the original. 2 small area of concern, on forehead and to the left of the nose on the bridge. No drainage. Red, irritated skin. 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 Formatting of this note might be different from the original. 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 Problem Complexity:  Chronic problem/illness, stable  Pertinent Data: LDL Calculated (mg/dL)  Date Value  01/09/2020 223 (H)   Cholesterol (mg/dl)  Date Value  01/09/2020 310 (H)   HDL (mg/dl)  Date Value  01/09/2020 56.5   Triglycerides (mg/dl)  Date Value  01/09/2020 151 (H)   AST (U/L)  Date Value  01/09/2020 16      Long term use of drug 05/01/2019   Last Assessment & Plan:  Formatting of this note might be different from the original. We reviewed age, sex, and risk factor related health maintenance interventions, largely based on the current USPSTF grade A and B recommendations.these included High blood pressure screening, diet and exercise counseling and immunization review and recommendations.   Neck pain    Nonintractable headache 08/05/2018   Osteoporosis    Palpitations 12/27/2018   Paresthesia 10/06/2020   Postmenopausal state 12/29/2021   Screen for colon cancer 07/06/2018   Last Assessment & Plan:  Formatting of this note might be  different from the original. FOBT cards given   Serum sickness 05/06/2020   Last Assessment & Plan:  Formatting of this note might be different from the original. She is doing very well and is followed closely with [facility]  Last Assessment & Plan:  Formatting of this note might be different from the original. Doing well and follows with [facility].   Sore throat 02/24/2021   Suspected 2019 novel coronavirus infection 02/24/2021   Tinnitus 01/12/2022   Vaccine counseling 01/26/2022   Last Assessment & Plan:  Formatting of this note might be different from the original. Advised to get the pneumonia vaccine which is recommended by the CDC's ACIP. Advised to get the Shingrix vaccine (shingles) which is also recommended by the CDC's ACIP, which may or may not be covered by her Medicare part D plan and which we do not stock at this office  given the uncertainty regarding reimburseme   Vertigo 01/12/2022   Vitamin D deficiency 01/12/2022   Vocal cord dysfunction 09/16/2013     Past Surgical History:  Procedure Laterality Date   ABDOMINAL HYSTERECTOMY     EYE SURGERY Right    HERNIA REPAIR     KNEE SURGERY Right    WRIST SURGERY Right    Family History  Problem Relation Age of Onset   Dementia Mother        heavy smoker   Heart failure Mother    Colon cancer Mother    Congestive Heart Failure Father    Diabetes Sister    Hyperlipidemia Sister    Diabetes Sister    Hyperlipidemia Sister    Hyperlipidemia Brother    Heart attack Brother    Stroke Brother    Seizures Brother    Stroke Brother    Hyperlipidemia Brother    Heart defect Daughter    Colon cancer Daughter        stage 3   Stomach cancer Neg Hx    Esophageal cancer Neg Hx    Social History   Tobacco Use   Smoking status: Never   Smokeless tobacco: Never  Vaping Use   Vaping Use: Never used  Substance Use Topics   Alcohol use: Never   Drug use: Never   Current Outpatient Medications  Medication  Sig Dispense Refill   acyclovir (ZOVIRAX) 400 MG tablet Take 1 tablet by mouth as needed.     alum hydroxide-mag trisilicate (GAVISCON) 73-22 MG CHEW chewable tablet Chew by mouth.     aspirin 81 MG tablet Take 81 mg by mouth daily.     docusate sodium (COLACE) 100 MG capsule Take 100 mg by mouth daily.     EPINEPHrine 0.3 mg/0.3 mL IJ SOAJ injection INJECT CONTENTS OF 1 PEN INTO OUTER THIGH FOR SEVERE ALLERGIC REACTION. CALL 911 AFTER USE.  1   ezetimibe (ZETIA) 10 MG tablet Take 1 tablet (10 mg total) by mouth daily. 90 tablet 2   famotidine (PEPCID) 10 MG tablet Take 10 mg by mouth 2 (two) times daily.     inclisiran (LEQVIO) 284 MG/1.5ML SOSY injection Inject 284 mg into the skin once.     predniSONE (DELTASONE) 50 MG tablet Take 50 mg by mouth as needed.      PRESCRIPTION MEDICATION Inject 1 each into the skin every 6 (six) months.     senna (SENOKOT) 8.6 MG tablet Take 1 tablet by mouth daily. As needed     No current facility-administered medications for this visit.   Allergies  Allergen Reactions   Bee Venom Other (See Comments)    Serum sickness reported with insect bite. Has been treated with prednisone only in the past per patient.  Also, fire ants.  Anaphylaxis reaction.   Benzonatate Other (See Comments)    Cannot take due due to all "caine" allergies   Cellulose Shortness Of Breath   Covid-19 (Mrna) Vaccine Anaphylaxis   Lidocaine Anaphylaxis    ALL CAINES PER PATIENT.   Peppermint Oil Anaphylaxis   Shellfish Allergy Anaphylaxis and Other (See Comments)    Pt Doesn't remember   Famotidine    Omeprazole    Atorvastatin Other (See Comments)    Dementia type symptoms, pain    Repatha [Evolocumab] Hives and Itching   Rosuvastatin Other (See Comments)    Dementia type symptoms, pain      Review of Systems: All systems reviewed and negative except where  noted in HPI.   Physical Exam: BP (!) 142/84   Pulse 90   Ht '5\' 8"'$  (1.727 m)   Wt 159 lb (72.1 kg)   SpO2  99%   BMI 24.18 kg/m  Constitutional: Pleasant,well-developed, female in no acute distress. HEENT: Normocephalic and atraumatic. Conjunctivae are normal. No scleral icterus. Cardiovascular: Normal rate, regular rhythm.  Pulmonary/chest: Effort normal and breath sounds normal. No wheezing, rales or rhonchi. Abdominal: Soft, nondistended, nontender. Bowel sounds active throughout. There are no masses palpable. No hepatomegaly. Extremities: No edema Neurological: Alert and oriented to person place and time. Skin: Skin is warm and dry. No rashes noted. Psychiatric: Normal mood and affect. Behavior is normal.  Labs 04/2019: CBC nml  Labs 01/2022: BMP nml. AST and ALT nml.   Ab U/S 03/26/02: IMPRESSION  NEGATIVE ABDOMINAL ULTRASOUND.  NO GALLSTONES   EGD 01/21/07: Normal examination  Colonoscopy 01/21/07: Good prep. Normal examination.  ASSESSMENT AND PLAN: GERD Dysphagia Hiatal hernia History of colon polyps Family history of colon cancer Patient presents with worsened GERD starting in 09/2022, which is still not adequately controlled. She does have some associated dysphagia and has a history of hiatal hernia that was diagnosed on a prior EGD. I did offer the patient the option of starting a PPI for improved GERD control, but she would prefer to see what her EGD shows first. She is due for colon cancer screening as well. Her last colonoscopy was in 2019 that showed some precancerous polyps. Patient does have strong family history of colon cancer so will get her set up for a colonoscopy.  - GERD handout - Cont Pepcid 20 mg BID - EGD/colonoscopy [facility]. Patient is allergic to lidocaine.   Christia Reading, MD  I spent 62 minutes of time, including in depth chart review, independent review of results as outlined above, communicating results with the patient directly, face-to-face time with the patient, coordinating care, ordering studies and medications as appropriate, and documentation.

## 2022-12-08 ENCOUNTER — Telehealth: Payer: Self-pay | Admitting: Pharmacy Technician

## 2022-12-08 NOTE — Telephone Encounter (Signed)
error 

## 2022-12-13 ENCOUNTER — Telehealth: Payer: Self-pay | Admitting: Internal Medicine

## 2022-12-13 NOTE — Telephone Encounter (Signed)
Caller would like a call back from [employer] regarding this patient's paperwork for medication assistance.  They would need the document faxed to [phone].

## 2022-12-13 NOTE — Telephone Encounter (Signed)
LM for Deneise Lever and Leqvio PAP was faxed on 11/09/22. Will send over to her also

## 2022-12-14 ENCOUNTER — Ambulatory Visit: Payer: Medicare PPO | Admitting: Emergency Medicine

## 2023-01-03 ENCOUNTER — Telehealth: Payer: Self-pay | Admitting: Internal Medicine

## 2023-01-03 NOTE — Telephone Encounter (Signed)
Per [facility], patient is denied patient assistance for Leqvio. Income cut off for household of 1 is $58k/annually and she is about $4k over that limit  Spoke with Leqvio access and reimbursement representative about this. He said he will check on a sample for dose NOW and then patient could apply for [employer] grant for [place].

## 2023-01-09 ENCOUNTER — Telehealth: Payer: Self-pay | Admitting: Pharmacy Technician

## 2023-01-09 NOTE — Telephone Encounter (Signed)
Sample dose arrived to [facility] office. Will coordinate with [place] for injection appointment.

## 2023-01-09 NOTE — Telephone Encounter (Signed)
Patient has been denied NPAF (leqvio free drug) due to increase of income. Sample will be supplied for next upcoming visit. [facility] has been notified that patient may be eligible for [employer]. Phone: [phone]  Awaiting response from [facility].

## 2023-01-10 ENCOUNTER — Encounter: Payer: Self-pay | Admitting: Internal Medicine

## 2023-01-19 ENCOUNTER — Ambulatory Visit (INDEPENDENT_AMBULATORY_CARE_PROVIDER_SITE_OTHER): Payer: Medicare PPO

## 2023-01-19 VITALS — BP 106/69 | HR 83 | Temp 98.1°F | Resp 16 | Ht 68.0 in | Wt 158.0 lb

## 2023-01-19 DIAGNOSIS — E7849 Other hyperlipidemia: Secondary | ICD-10-CM

## 2023-01-19 MED ORDER — INCLISIRAN SODIUM 284 MG/1.5ML ~~LOC~~ SOSY
284.0000 mg | PREFILLED_SYRINGE | Freq: Once | SUBCUTANEOUS | Status: AC
  Administered 2023-01-19: 284 mg via SUBCUTANEOUS
  Filled 2023-01-19: qty 1.5

## 2023-01-19 NOTE — Progress Notes (Signed)
Diagnosis: Hyperlipidemia  Provider:  Marshell Garfinkel MD  Procedure: Injection  Leqvio (inclisiran), Dose: 284 mg, Site: subcutaneous, Number of injections: 1  Administered in right arm  Post Care: Observation period completed  Discharge: Condition: Stable, Destination: Home . AVS Declined  Performed by:  Binnie Kand, RN

## 2023-01-23 ENCOUNTER — Other Ambulatory Visit: Payer: Self-pay

## 2023-01-23 DIAGNOSIS — K219 Gastro-esophageal reflux disease without esophagitis: Secondary | ICD-10-CM

## 2023-01-23 DIAGNOSIS — R1310 Dysphagia, unspecified: Secondary | ICD-10-CM

## 2023-01-23 DIAGNOSIS — K449 Diaphragmatic hernia without obstruction or gangrene: Secondary | ICD-10-CM

## 2023-01-23 DIAGNOSIS — Z800 Family history of malignant neoplasm of digestive organs: Secondary | ICD-10-CM

## 2023-01-23 DIAGNOSIS — Z860100 Personal history of colon polyps, unspecified: Secondary | ICD-10-CM

## 2023-01-24 ENCOUNTER — Ambulatory Visit (AMBULATORY_SURGERY_CENTER): Payer: Medicare PPO | Admitting: Internal Medicine

## 2023-01-24 ENCOUNTER — Encounter: Payer: Self-pay | Admitting: Internal Medicine

## 2023-01-24 VITALS — BP 114/67 | HR 63 | Temp 99.3°F | Resp 10 | Ht 69.0 in | Wt 159.0 lb

## 2023-01-24 DIAGNOSIS — K2990 Gastroduodenitis, unspecified, without bleeding: Secondary | ICD-10-CM

## 2023-01-24 DIAGNOSIS — K2100 Gastro-esophageal reflux disease with esophagitis, without bleeding: Secondary | ICD-10-CM | POA: Diagnosis not present

## 2023-01-24 DIAGNOSIS — K208 Other esophagitis without bleeding: Secondary | ICD-10-CM

## 2023-01-24 DIAGNOSIS — Z86010 Personal history of colonic polyps: Secondary | ICD-10-CM

## 2023-01-24 DIAGNOSIS — K317 Polyp of stomach and duodenum: Secondary | ICD-10-CM | POA: Diagnosis not present

## 2023-01-24 DIAGNOSIS — D122 Benign neoplasm of ascending colon: Secondary | ICD-10-CM | POA: Diagnosis not present

## 2023-01-24 DIAGNOSIS — K219 Gastro-esophageal reflux disease without esophagitis: Secondary | ICD-10-CM

## 2023-01-24 DIAGNOSIS — Z09 Encounter for follow-up examination after completed treatment for conditions other than malignant neoplasm: Secondary | ICD-10-CM | POA: Diagnosis not present

## 2023-01-24 DIAGNOSIS — K635 Polyp of colon: Secondary | ICD-10-CM

## 2023-01-24 DIAGNOSIS — K2970 Gastritis, unspecified, without bleeding: Secondary | ICD-10-CM

## 2023-01-24 DIAGNOSIS — D120 Benign neoplasm of cecum: Secondary | ICD-10-CM | POA: Diagnosis not present

## 2023-01-24 DIAGNOSIS — K259 Gastric ulcer, unspecified as acute or chronic, without hemorrhage or perforation: Secondary | ICD-10-CM

## 2023-01-24 MED ORDER — SODIUM CHLORIDE 0.9 % IV SOLN: 500.0000 mL | Freq: Once | INTRAVENOUS | Status: DC

## 2023-01-24 MED ORDER — OMEPRAZOLE 40 MG PO CPDR: 40.0000 mg | DELAYED_RELEASE_CAPSULE | Freq: Two times a day (BID) | ORAL | 0 refills | Status: DC

## 2023-01-24 NOTE — Progress Notes (Signed)
Called to room to assist during endoscopic procedure.  Patient ID and intended procedure confirmed with present staff. Received instructions for my participation in the procedure from the performing physician.  

## 2023-01-24 NOTE — Progress Notes (Signed)
Report to PACU, RN, vss, BBS= Clear.  

## 2023-01-24 NOTE — Patient Instructions (Addendum)
Handout on esophagitis, gastritis, hiatal hernia, diverticulosis, polyps, and hemorrhoids given to patient. Await pathology results Pick up prescription for Prilosec (Omeprazole) 40 mg twice a day for 8 weeks from pharmacy -  Resume previous diet and continue present medications Repeat colonoscopy for surveillance will be determined based off of pathology results. Return to GI office in 6 weeks - first available appointment is 04/12/23 at 2:30 pm (if this time/date doesn't work for you please call the office to re-schedule)  YOU HAD AN ENDOSCOPIC PROCEDURE TODAY AT [facility]:   Refer to the procedure report that was given to you for any specific questions about what was found during the examination.  If the procedure report does not answer your questions, please call your gastroenterologist to clarify.  If you requested that your care partner not be given the details of your procedure findings, then the procedure report has been included in a sealed envelope for you to review at your convenience later.  YOU SHOULD EXPECT: Some feelings of bloating in the abdomen. Passage of more gas than usual.  Walking can help get rid of the air that was put into your GI tract during the procedure and reduce the bloating. If you had a lower endoscopy (such as a colonoscopy or flexible sigmoidoscopy) you may notice spotting of blood in your stool or on the toilet paper. If you underwent a bowel prep for your procedure, you may not have a normal bowel movement for a few days.  Please Note:  You might notice some irritation and congestion in your nose or some drainage.  This is from the oxygen used during your procedure.  There is no need for concern and it should clear up in a day or so.  SYMPTOMS TO REPORT IMMEDIATELY:  Following lower endoscopy (colonoscopy or flexible sigmoidoscopy):  Excessive amounts of blood in the stool  Significant tenderness or worsening of abdominal pains  Swelling of  the abdomen that is new, acute  Fever of 100F or higher  Following upper endoscopy (EGD)  Vomiting of blood or coffee ground material  New chest pain or pain under the shoulder blades  Painful or persistently difficult swallowing  New shortness of breath  Fever of 100F or higher  Black, tarry-looking stools  For urgent or emergent issues, a gastroenterologist can be reached at any hour by calling [phone]. Do not use MyChart messaging for urgent concerns.    DIET:  We do recommend a small meal at first, but then you may proceed to your regular diet.  Drink plenty of fluids but you should avoid alcoholic beverages for 24 hours.  ACTIVITY:  You should plan to take it easy for the rest of today and you should NOT DRIVE or use heavy machinery until tomorrow (because of the sedation medicines used during the test).    FOLLOW UP: Our staff will call the number listed on your records the next business day following your procedure.  We will call around 7:15- 8:00 am to check on you and address any questions or concerns that you may have regarding the information given to you following your procedure. If we do not reach you, we will leave a message.     If any biopsies were taken you will be contacted by phone or by letter within the next 1-3 weeks.  Please call us at [phone] if you have not heard about the biopsies in 3 weeks.    SIGNATURES/CONFIDENTIALITY: You and/or your care  partner have signed paperwork which will be entered into your electronic medical record.  These signatures attest to the fact that that the information above on your After Visit Summary has been reviewed and is understood.  Full responsibility of the confidentiality of this discharge information lies with you and/or your care-partner.

## 2023-01-24 NOTE — Op Note (Addendum)
[facility] Patient Name: [facility] Procedure Date: 01/24/2023 2:10 PM MRN: [id number] Endoscopist: Georgian Co , , [id number] Age: 67 Referring MD:  Date of Birth: [birth year] Gender: Female Account #: [id number] Procedure:                Upper GI endoscopy Indications:              Dysphagia, Heartburn Medicines:                Monitored Anesthesia Care Procedure:                Pre-Anesthesia Assessment:                           - Prior to the procedure, a History and Physical                            was performed, and patient medications and                            allergies were reviewed. The patient's tolerance of                            previous anesthesia was also reviewed. The risks                            and benefits of the procedure and the sedation                            options and risks were discussed with the patient.                            All questions were answered, and informed consent                            was obtained. Prior Anticoagulants: The patient has                            taken no anticoagulant or antiplatelet agents. ASA                            Grade Assessment: II - A patient with mild systemic                            disease. After reviewing the risks and benefits,                            the patient was deemed in satisfactory condition to                            undergo the procedure.                           After obtaining informed consent, the endoscope was  passed under direct vision. Throughout the                            procedure, the patient's blood pressure, pulse, and                            oxygen saturations were monitored continuously. The                            GIF [id number] was introduced through the                            mouth, and advanced to the second part of duodenum.                            The upper GI endoscopy was  accomplished without                            difficulty. The patient tolerated the procedure                            well. Scope In: Scope Out: Findings:                 LA Grade A (one or more mucosal breaks less than 5                            mm, not extending between tops of 2 mucosal folds)                            esophagitis with no bleeding was found in the                            distal esophagus.                           Salmon-colored mucosa was present. The maximum                            longitudinal extent of these esophageal mucosal                            changes was 2 cm in length. Mucosa was biopsied                            with a cold forceps for histology. One specimen                            bottle was sent to pathology.                           A hiatal hernia was present.                           Three 3 to 5  mm sessile polyps with no stigmata of                            recent bleeding were found in the gastric fundus.                            These polyps were removed with a cold snare.                            Resection and retrieval were complete.                           Localized mild inflammation characterized by                            congestion (edema), erosions and erythema was found                            in the gastric antrum. Biopsies were taken with a                            cold forceps for histology.                           Localized inflammation characterized by congestion                            (edema) and erythema was found in the duodenal                            bulb. Biopsies were taken with a cold forceps for                            histology. Complications:            No immediate complications. Estimated Blood Loss:     Estimated blood loss was minimal. Impression:               - LA Grade A reflux esophagitis with no bleeding.                           - Salmon-colored mucosa.  Biopsied.                           - Hiatal hernia.                           - Three gastric polyps. Resected and retrieved.                           - Gastritis. Biopsied.                           - Duodenitis. Biopsied. Recommendation:           - Await pathology results.                           -  Use Prilosec (omeprazole) 40 mg PO BID for 8                            weeks. Patient states that her allergy to                            omeprazole is a depressed mood. She was willing to                            try this medication again. If she were to develop                            any side effects, she will let us know.                           - Perform a colonoscopy today. Dr Georgian Co "Lyndee Leo" Lorenso Courier,  01/24/2023 3:07:13 PM

## 2023-01-24 NOTE — Op Note (Signed)
[facility] Patient Name: Rebecca Montgomery Procedure Date: 01/24/2023 2:09 PM MRN: [id number] Endoscopist: Adline Mango [place] , , [id number] Age: 67 Referring MD:  Date of Birth: 1956/07/26 Gender: Female Account #: [id number] Procedure:                Colonoscopy Indications:              Family history of colon cancer in a first-degree                            relative before age 13 years Medicines:                Monitored Anesthesia Care Procedure:                Pre-Anesthesia Assessment:                           - Prior to the procedure, a History and Physical                            was performed, and patient medications and                            allergies were reviewed. The patient's tolerance of                            previous anesthesia was also reviewed. The risks                            and benefits of the procedure and the sedation                            options and risks were discussed with the patient.                            All questions were answered, and informed consent                            was obtained. Prior Anticoagulants: The patient has                            taken no anticoagulant or antiplatelet agents. ASA                            Grade Assessment: II - A patient with mild systemic                            disease. After reviewing the risks and benefits,                            the patient was deemed in satisfactory condition to                            undergo the procedure.  After obtaining informed consent, the colonoscope                            was passed under direct vision. Throughout the                            procedure, the patient's blood pressure, pulse, and                            oxygen saturations were monitored continuously. The                            Olympus [id number]) Colonoscope was                            introduced through the anus  and advanced to the the                            terminal ileum. The colonoscopy was performed                            without difficulty. The patient tolerated the                            procedure well. The quality of the bowel                            preparation was good. The terminal ileum, ileocecal                            valve, appendiceal orifice, and rectum were                            photographed. Scope In: 2:36:08 PM Scope Out: 2:58:21 PM Scope Withdrawal Time: 0 hours 16 minutes 14 seconds  Total Procedure Duration: 0 hours 22 minutes 13 seconds  Findings:                 The terminal ileum appeared normal.                           Four sessile polyps were found in the ascending                            colon and cecum. The polyps were 3 to 6 mm in size.                            These polyps were removed with a cold snare.                            Resection and retrieval were complete.                           Multiple diverticula were found in the sigmoid  colon and descending colon.                           Non-bleeding internal hemorrhoids were found during                            retroflexion. Complications:            No immediate complications. Estimated Blood Loss:     Estimated blood loss was minimal. Impression:               - The examined portion of the ileum was normal.                           - Four 3 to 6 mm polyps in the ascending colon and                            in the cecum, removed with a cold snare. Resected                            and retrieved.                           - Diverticulosis in the sigmoid colon and in the                            descending colon.                           - Non-bleeding internal hemorrhoids. Recommendation:           - Discharge patient to home (with escort).                           - Await pathology results.                           - Return to GI  clinic in 6 weeks.                           - The findings and recommendations were discussed                            with the patient. Dr Georgian Co "Lyndee Leo" Lorenso Courier,  01/24/2023 3:09:56 PM

## 2023-01-24 NOTE — Progress Notes (Signed)
GASTROENTEROLOGY PROCEDURE H&P NOTE   Primary Care Physician: Horald Pollen, MD    Reason for Procedure:   Dysphagia, GERD, history of colon polyps, family history of colon cancer  Plan:    EGD/colonoscopy  Patient is appropriate for endoscopic procedure(s) in the ambulatory ([place]) setting.  The nature of the procedure, as well as the risks, benefits, and alternatives were carefully and thoroughly reviewed with the patient. Ample time for discussion and questions allowed. The patient understood, was satisfied, and agreed to proceed.     HPI: Rebecca Montgomery is a 67 y.o. female who presents for EGD/colonoscopy for evaluation of dysphagia, history of colon polyps, family history of colon cancer, GERD .  Patient was most recently seen in the Gastroenterology Clinic on 11/21/22.  No interval change in medical history since that appointment. Please refer to that note for full details regarding GI history and clinical presentation.   Past Medical History:  Diagnosis Date   Acid reflux    Acute conjunctivitis of left eye 02/24/2021   Arthritis    Atypical chest pain 12/27/2018   Back pain    Basal cell adenocarcinoma    Body mass index (BMI) of 23.0 to 23.9 in adult 01/26/2022   Last Assessment & Plan:  Formatting of this note might be different from the original. BMI Assessment: Current Body mass index is 23.02 kg/m.  Patient BMI currently is in the acceptable range (>=18.5 and < 25 kg/m2) . Current barriers to healthy weight management include none.  BMI Plan:  Today, Rebecca Montgomery and I have discussed methods to help address her current weight.   No new intervention is indica   Cellulitis of left lower extremity 01/26/2020   Last Assessment & Plan:  Formatting of this note might be different from the original. Mild swelling and pain on examination. No drainage. 2 other small areas just on each side of the original area.  Will treat with antibiotic. She is to notify if not improving.    Cervical cancer screening 01/24/2021   Last Assessment & Plan:  Formatting of this note might be different from the original. She requested deference of gyn exam with pap today due to right hand fx. She will call to reschedule this at a later date.   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 Pertinent Data:   Current medication includes: diet modification and exercise This patient does not have an active medication from one of the medication groupers..  Last Lipid Values Cholesterol (mg/dl)  Date Value  04/29/2019 271 (H)  07/04/2018 306 (H)   Triglycerides (mg/dl)  Date Value  04/29/2019 214 (H)  08/22/2   Flu-like symptoms 02/24/2021   GERD (gastroesophageal reflux disease) 04/03/2018   Last Assessment & Plan:  Formatting of this note might be different from the original. Assessment Chronic and stable No compliance issue noted with today visit  Plan Continue current treatment regimen Reviewed risk of GERD Reviewed principals of treatment Patient medication Prilosec will be continued  Last Assessment & Plan:  Formatting of this note might be different from the original. Problem co   H/O total hysterectomy 08/09/2016   Headache    History of anaphylaxis 01/20/2015   History of skin cancer 10/03/2018   Last Assessment & Plan:  Formatting of this note might be different  from the original. 2 small area of concern, on forehead and to the left of the nose on the bridge. No drainage. Red, irritated skin. 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 Formatting of this note might be different from the original. 3/17- genital wound  culture= HSV-2   Hypercholesteremia    Hyperlipidemia 07/04/2018   Last Assessment & Plan:  Formatting of this note is different from the original. Problem Complexity:  Chronic problem/illness, stable  Pertinent Data: LDL Calculated (mg/dL)  Date Value  01/09/2020 223 (H)   Cholesterol (mg/dl)  Date Value  01/09/2020 310 (H)   HDL (mg/dl)  Date Value  01/09/2020 56.5   Triglycerides (mg/dl)  Date Value  01/09/2020 151 (H)   AST (U/L)  Date Value  01/09/2020 16      Long term use of drug 05/01/2019   Last Assessment & Plan:  Formatting of this note might be different from the original. We reviewed age, sex, and risk factor related health maintenance interventions, largely based on the current USPSTF grade A and B recommendations.these included High blood pressure screening, diet and exercise counseling and immunization review and recommendations.   Neck pain    Neuromuscular disorder (HCC)    fibromyalgia   Nonintractable headache 08/05/2018   Osteoporosis    Palpitations 12/27/2018   Paresthesia 10/06/2020   Postmenopausal state 12/29/2021   Screen for colon cancer 07/06/2018   Last Assessment & Plan:  Formatting of this note might be different from the original. FOBT cards given   Serum sickness 05/06/2020   Last Assessment & Plan:  Formatting of this note might be different from the original. She is doing very well and is followed closely with [facility]  Last Assessment & Plan:  Formatting of this note might be different from the original. Doing well and follows with [facility].   Sore throat 02/24/2021   Suspected 2019 novel coronavirus infection 02/24/2021   Tinnitus 01/12/2022   Vaccine counseling 01/26/2022   Last Assessment & Plan:  Formatting of this note might be different from the original. Advised to get the pneumonia vaccine which is recommended by the CDC's ACIP. Advised to get the Shingrix vaccine (shingles) which is also recommended by the CDC's ACIP, which may or  may not be covered by her Medicare part D plan and which we do not stock at this office given the uncertainty regarding reimburseme   Vertigo 01/12/2022   Vitamin D deficiency 01/12/2022   Vocal cord dysfunction 09/16/2013    Past Surgical History:  Procedure Laterality Date   ABDOMINAL HYSTERECTOMY     EYE SURGERY Right    HERNIA REPAIR     KNEE SURGERY Right    WRIST SURGERY Right    left wrist in 2023    Prior to Admission medications   Medication Sig Start Date End Date Taking? Authorizing Provider  aspirin 81 MG tablet Take 81 mg by mouth daily.   Yes [provider]  docusate sodium (COLACE) 100 MG capsule Take 100 mg by mouth daily.   Yes [provider]  ezetimibe (ZETIA) 10 MG tablet Take 1 tablet (10 mg total) by mouth daily. 10/02/22  Yes [name], Nadean Corwin, MD  famotidine (PEPCID) 10 MG tablet Take 10 mg by mouth 2 (two) times daily.   Yes  [provider]  inclisiran (LEQVIO) 284 MG/1.5ML SOSY injection Inject 284 mg into the skin once.   Yes [provider]  PRESCRIPTION MEDICATION Inject 1 each into the skin every 6 (six) months.   Yes [provider]  senna (SENOKOT) 8.6 MG tablet Take 1 tablet by mouth daily. As needed   Yes [provider]  acyclovir (ZOVIRAX) 400 MG tablet Take 1 tablet by mouth as needed. 08/23/20   [provider]  alum hydroxide-mag trisilicate (GAVISCON) [id number] MG CHEW chewable tablet Chew by mouth. Patient not taking: Reported on 01/24/2023    [provider]  EPINEPHrine 0.3 mg/0.3 mL IJ SOAJ injection INJECT CONTENTS OF 1 PEN INTO OUTER THIGH FOR SEVERE ALLERGIC REACTION. CALL 911 AFTER USE. 07/25/18   [provider]  predniSONE (DELTASONE) 50 MG tablet Take 50 mg by mouth as needed.     [provider]    Current Outpatient Medications  Medication Sig Dispense Refill   aspirin 81 MG tablet Take 81 mg by mouth daily.     docusate sodium (COLACE) 100 MG  capsule Take 100 mg by mouth daily.     ezetimibe (ZETIA) 10 MG tablet Take 1 tablet (10 mg total) by mouth daily. 90 tablet 2   famotidine (PEPCID) 10 MG tablet Take 10 mg by mouth 2 (two) times daily.     inclisiran (LEQVIO) 284 MG/1.5ML SOSY injection Inject 284 mg into the skin once.     PRESCRIPTION MEDICATION Inject 1 each into the skin every 6 (six) months.     senna (SENOKOT) 8.6 MG tablet Take 1 tablet by mouth daily. As needed     acyclovir (ZOVIRAX) 400 MG tablet Take 1 tablet by mouth as needed.     alum hydroxide-mag trisilicate (GAVISCON) [id number] MG CHEW chewable tablet Chew by mouth. (Patient not taking: Reported on 01/24/2023)     EPINEPHrine 0.3 mg/0.3 mL IJ SOAJ injection INJECT CONTENTS OF 1 PEN INTO OUTER THIGH FOR SEVERE ALLERGIC REACTION. CALL 911 AFTER USE.  1   predniSONE (DELTASONE) 50 MG tablet Take 50 mg by mouth as needed.      Current Facility-Administered Medications  Medication Dose Route Frequency Provider Last Rate Last Admin   0.9 %  sodium chloride infusion  500 mL Intravenous Once Sharyn [place]er, MD        Allergies as of 01/24/2023 - Review Complete 01/24/2023  Allergen Reaction Noted   Bee venom Other (See Comments) 03/18/2018   Benzonatate Other (See Comments) 07/26/2016   Cellulose Shortness Of Breath 04/30/2013   Covid-19 (mrna) vaccine Anaphylaxis 01/26/2020   Lidocaine Anaphylaxis 03/18/2018   Peppermint oil Anaphylaxis 06/08/2016   Shellfish allergy Anaphylaxis and Other (See Comments) 08/02/2016   Famotidine  07/09/2020   Omeprazole  07/09/2020   Atorvastatin Other (See Comments) 04/30/2013   Repatha [evolocumab] Hives and Itching 09/24/2020   Rosuvastatin Other (See Comments) 04/30/2013    Family History  Problem Relation Age of Onset   Dementia Mother        heavy smoker   Heart failure Mother    Colon cancer Mother    Congestive Heart Failure Father    Diabetes Sister    Hyperlipidemia Sister    Diabetes Sister    Hyperlipidemia  Sister    Hyperlipidemia Brother    Heart attack Brother    Stroke Brother    Seizures Brother    Stroke Brother    Hyperlipidemia Brother    Heart defect Daughter  Colon cancer Daughter        stage 3   Stomach cancer Neg Hx    Esophageal cancer Neg Hx     Social History   Socioeconomic History   Marital status: Divorced    Spouse name: Not on file   Number of children: 2   Years of education: masters   Highest education level: Not on file  Occupational History   Occupation: Retired  Tobacco Use   Smoking status: Never   Smokeless tobacco: Never  Vaping Use   Vaping Use: Never used  Substance and Sexual Activity   Alcohol use: Never   Drug use: Never   Sexual activity: Not on file  Other Topics Concern   Not on file  Social History Narrative   Lives at home alone.   Right-handed.   Glass of tea in the morning.    Social Determinants of Health   Financial Resource Strain: Not on file  Food Insecurity: Not on file  Transportation Needs: Not on file  Physical Activity: Sufficiently Active (12/21/2020)   Exercise Vital Sign    Days of Exercise per Week: 5 days    Minutes of Exercise per Session: 150+ min  Stress: Stress Concern Present (11/23/2020)   [place]    Feeling of Stress : Rather much  Social Connections: Not on file  Intimate Partner Violence: Not on file    Physical Exam: Vital signs in last 24 hours: BP 126/71   Pulse 80   Temp 99.3 F (37.4 C) (Temporal)   Ht '5\' 9"'$  (1.753 m)   Wt 159 lb (72.1 kg)   SpO2 96%   BMI 23.48 kg/m  GEN: NAD EYE: Sclerae anicteric ENT: MMM CV: Non-tachycardic Pulm: No increased WOB GI: Soft NEURO:  Alert & Oriented   Christia Reading, MD [facility]   01/24/2023 2:01 PM

## 2023-01-25 ENCOUNTER — Telehealth: Payer: Self-pay

## 2023-01-25 NOTE — Telephone Encounter (Signed)
Follow up call placed, no answer, no VM.  

## 2023-01-29 ENCOUNTER — Encounter: Payer: Self-pay | Admitting: Internal Medicine

## 2023-02-05 ENCOUNTER — Encounter: Payer: Self-pay | Admitting: Emergency Medicine

## 2023-02-05 ENCOUNTER — Ambulatory Visit: Payer: Medicare PPO | Admitting: Emergency Medicine

## 2023-02-05 VITALS — BP 139/74 | HR 80 | Temp 98.2°F | Ht 69.0 in | Wt 157.1 lb

## 2023-02-05 DIAGNOSIS — E785 Hyperlipidemia, unspecified: Secondary | ICD-10-CM

## 2023-02-05 DIAGNOSIS — K219 Gastro-esophageal reflux disease without esophagitis: Secondary | ICD-10-CM

## 2023-02-05 DIAGNOSIS — F4323 Adjustment disorder with mixed anxiety and depressed mood: Secondary | ICD-10-CM

## 2023-02-05 DIAGNOSIS — I10 Essential (primary) hypertension: Secondary | ICD-10-CM | POA: Diagnosis not present

## 2023-02-05 LAB — CBC WITH DIFFERENTIAL/PLATELET
Basophils Absolute: 0.1 10*3/uL (ref 0.0–0.1)
Basophils Relative: 0.8 % (ref 0.0–3.0)
Eosinophils Absolute: 0.1 10*3/uL (ref 0.0–0.7)
Eosinophils Relative: 1.2 % (ref 0.0–5.0)
HCT: 38.7 % (ref 36.0–46.0)
Hemoglobin: 13.1 g/dL (ref 12.0–15.0)
Lymphocytes Relative: 26.2 % (ref 12.0–46.0)
Lymphs Abs: 2 10*3/uL (ref 0.7–4.0)
MCHC: 33.8 g/dL (ref 30.0–36.0)
MCV: 90.9 fl (ref 78.0–100.0)
Monocytes Absolute: 0.5 10*3/uL (ref 0.1–1.0)
Monocytes Relative: 6.8 % (ref 3.0–12.0)
Neutro Abs: 5 10*3/uL (ref 1.4–7.7)
Neutrophils Relative %: 65 % (ref 43.0–77.0)
Platelets: 368 10*3/uL (ref 150.0–400.0)
RBC: 4.26 Mil/uL (ref 3.87–5.11)
RDW: 12.4 % (ref 11.5–15.5)
WBC: 7.7 10*3/uL (ref 4.0–10.5)

## 2023-02-05 LAB — HEMOGLOBIN A1C: Hgb A1c MFr Bld: 5.9 % (ref 4.6–6.5)

## 2023-02-05 LAB — COMPREHENSIVE METABOLIC PANEL
ALT: 19 U/L (ref 0–35)
AST: 21 U/L (ref 0–37)
Albumin: 4.5 g/dL (ref 3.5–5.2)
Alkaline Phosphatase: 51 U/L (ref 39–117)
BUN: 12 mg/dL (ref 6–23)
CO2: 27 mEq/L (ref 19–32)
Calcium: 9.4 mg/dL (ref 8.4–10.5)
Chloride: 100 mEq/L (ref 96–112)
Creatinine, Ser: 0.84 mg/dL (ref 0.40–1.20)
GFR: 72.24 mL/min (ref >60.00)
Glucose, Bld: 96 mg/dL (ref 70–99)
Potassium: 3.8 mEq/L (ref 3.5–5.1)
Sodium: 136 mEq/L (ref 135–145)
Total Bilirubin: 0.2 mg/dL (ref 0.2–1.2)
Total Protein: 7.4 g/dL (ref 6.0–8.3)

## 2023-02-05 NOTE — Assessment & Plan Note (Signed)
Stable and well-controlled Presently on omeprazole 40 mg daily and famotidine 10 mg twice a day

## 2023-02-05 NOTE — Patient Instructions (Signed)
Hypertension, Adult High blood pressure (hypertension) is when the force of blood pumping through the arteries is too strong. The arteries are the blood vessels that carry blood from the heart throughout the body. Hypertension forces the heart to work harder to pump blood and may cause arteries to become narrow or stiff. Untreated or uncontrolled hypertension can lead to a heart attack, heart failure, a stroke, kidney disease, and other problems. A blood pressure reading consists of a higher number over a lower number. Ideally, your blood pressure should be below 120/80. The first ("top") number is called the systolic pressure. It is a measure of the pressure in your arteries as your heart beats. The second ("bottom") number is called the diastolic pressure. It is a measure of the pressure in your arteries as the heart relaxes. What are the causes? The exact cause of this condition is not known. There are some conditions that result in high blood pressure. What increases the risk? Certain factors may make you more likely to develop high blood pressure. Some of these risk factors are under your control, including: Smoking. Not getting enough exercise or physical activity. Being overweight. Having too much fat, sugar, calories, or salt (sodium) in your diet. Drinking too much alcohol. Other risk factors include: Having a personal history of heart disease, diabetes, high cholesterol, or kidney disease. Stress. Having a family history of high blood pressure and high cholesterol. Having obstructive sleep apnea. Age. The risk increases with age. What are the signs or symptoms? High blood pressure may not cause symptoms. Very high blood pressure (hypertensive crisis) may cause: Headache. Fast or irregular heartbeats (palpitations). Shortness of breath. Nosebleed. Nausea and vomiting. Vision changes. Severe chest pain, dizziness, and seizures. How is this diagnosed? This condition is diagnosed by  measuring your blood pressure while you are seated, with your arm resting on a flat surface, your legs uncrossed, and your feet flat on the floor. The cuff of the blood pressure monitor will be placed directly against the skin of your upper arm at the level of your heart. Blood pressure should be measured at least twice using the same arm. Certain conditions can cause a difference in blood pressure between your right and left arms. If you have a high blood pressure reading during one visit or you have normal blood pressure with other risk factors, you may be asked to: Return on a different day to have your blood pressure checked again. Monitor your blood pressure at home for 1 week or longer. If you are diagnosed with hypertension, you may have other blood or imaging tests to help your health care provider understand your overall risk for other conditions. How is this treated? This condition is treated by making healthy lifestyle changes, such as eating healthy foods, exercising more, and reducing your alcohol intake. You may be referred for counseling on a healthy diet and physical activity. Your health care provider may prescribe medicine if lifestyle changes are not enough to get your blood pressure under control and if: Your systolic blood pressure is above 130. Your diastolic blood pressure is above 80. Your personal target blood pressure may vary depending on your medical conditions, your age, and other factors. Follow these instructions at home: Eating and drinking  Eat a diet that is high in fiber and potassium, and low in sodium, added sugar, and fat. An example of this eating plan is called the DASH diet. DASH stands for Dietary Approaches to Stop Hypertension. To eat this way: Eat   plenty of fresh fruits and vegetables. Try to fill one half of your plate at each meal with fruits and vegetables. Eat whole grains, such as whole-wheat pasta, brown rice, or whole-grain bread. Fill about one  fourth of your plate with whole grains. Eat or drink low-fat dairy products, such as skim milk or low-fat yogurt. Avoid fatty cuts of meat, processed or cured meats, and poultry with skin. Fill about one fourth of your plate with lean proteins, such as fish, chicken without skin, beans, eggs, or tofu. Avoid pre-made and processed foods. These tend to be higher in sodium, added sugar, and fat. Reduce your daily sodium intake. Many people with hypertension should eat less than 1,500 mg of sodium a day. Do not drink alcohol if: Your health care provider tells you not to drink. You are pregnant, may be pregnant, or are planning to become pregnant. If you drink alcohol: Limit how much you have to: 0-1 drink a day for women. 0-2 drinks a day for men. Know how much alcohol is in your drink. In the [place] one drink equals one 12 oz bottle of beer (355 mL), one 5 oz glass of wine (148 mL), or one 1 oz glass of hard liquor (44 mL). Lifestyle  Work with your health care provider to maintain a healthy body weight or to lose weight. Ask what an ideal weight is for you. Get at least 30 minutes of exercise that causes your heart to beat faster (aerobic exercise) most days of the week. Activities may include walking, swimming, or biking. Include exercise to strengthen your muscles (resistance exercise), such as Pilates or lifting weights, as part of your weekly exercise routine. Try to do these types of exercises for 30 minutes at least 3 days a week. Do not use any products that contain nicotine or tobacco. These products include cigarettes, chewing tobacco, and vaping devices, such as e-cigarettes. If you need help quitting, ask your health care provider. Monitor your blood pressure at home as told by your health care provider. Keep all follow-up visits. This is important. Medicines Take over-the-counter and prescription medicines only as told by your health care provider. Follow directions carefully. Blood  pressure medicines must be taken as prescribed. Do not skip doses of blood pressure medicine. Doing this puts you at risk for problems and can make the medicine less effective. Ask your health care provider about side effects or reactions to medicines that you should watch for. Contact a health care provider if you: Think you are having a reaction to a medicine you are taking. Have headaches that keep coming back (recurring). Feel dizzy. Have swelling in your ankles. Have trouble with your vision. Get help right away if you: Develop a severe headache or confusion. Have unusual weakness or numbness. Feel faint. Have severe pain in your chest or abdomen. Vomit repeatedly. Have trouble breathing. These symptoms may be an emergency. Get help right away. Call 911. Do not wait to see if the symptoms will go away. Do not drive yourself to the hospital. Summary Hypertension is when the force of blood pumping through your arteries is too strong. If this condition is not controlled, it may put you at risk for serious complications. Your personal target blood pressure may vary depending on your medical conditions, your age, and other factors. For most people, a normal blood pressure is less than 120/80. Hypertension is treated with lifestyle changes, medicines, or a combination of both. Lifestyle changes include losing weight, eating a healthy,   low-sodium diet, exercising more, and limiting alcohol. This information is not intended to replace advice given to you by your health care provider. Make sure you discuss any questions you have with your health care provider. Document Revised: 09/06/2021 Document Reviewed: 09/06/2021 Elsevier Patient Education  2023 [employer]  

## 2023-02-05 NOTE — Assessment & Plan Note (Signed)
Stable and well-controlled. On Zetia 10 mg daily

## 2023-02-05 NOTE — Progress Notes (Signed)
Rebecca Montgomery 67 y.o.   Chief Complaint  Patient presents with   Medical Management of Chronic Issues    85mnth f/u appt, no concerns     HISTORY OF PRESENT ILLNESS: This is a 67 y.o. female A1A here for follow-up of hypertension Presently under increased amount of stress due to daughter's illness and recent passing of her brother No other complaints or medical concerns today. BP Readings from Last 3 Encounters:  02/05/23 139/74  01/24/23 114/67  01/19/23 106/69   Wt Readings from Last 3 Encounters:  02/05/23 157 lb 2 oz (71.3 kg)  01/24/23 159 lb (72.1 kg)  01/19/23 158 lb (71.7 kg)     HPI   Prior to Admission medications   Medication Sig Start Date End Date Taking? Authorizing Provider  acyclovir (ZOVIRAX) 400 MG tablet Take 1 tablet by mouth as needed. 08/23/20  Yes [provider]  aspirin 81 MG tablet Take 81 mg by mouth daily.   Yes [provider]  docusate sodium (COLACE) 100 MG capsule Take 100 mg by mouth daily.   Yes [provider]  EPINEPHrine 0.3 mg/0.3 mL IJ SOAJ injection INJECT CONTENTS OF 1 PEN INTO OUTER THIGH FOR SEVERE ALLERGIC REACTION. CALL 911 AFTER USE. 07/25/18  Yes [provider]  ezetimibe (ZETIA) 10 MG tablet Take 1 tablet (10 mg total) by mouth daily. 10/02/22  Yes [name], Nadean Corwin, MD  famotidine (PEPCID) 10 MG tablet Take 10 mg by mouth 2 (two) times daily.   Yes [provider]  inclisiran (LEQVIO) 284 MG/1.5ML SOSY injection Inject 284 mg into the skin once.   Yes [provider]  omeprazole (PRILOSEC) 40 MG capsule Take 1 capsule (40 mg total) by mouth 2 (two) times daily. 01/24/23 04/24/23 Yes Sharyn Creamer, MD  predniSONE (DELTASONE) 50 MG tablet Take 50 mg by mouth as needed.    Yes [provider]  PRESCRIPTION MEDICATION Inject 1 each into the skin every 6 (six) months.   Yes [provider]  senna (SENOKOT) 8.6 MG tablet Take 1 tablet by mouth daily. As needed    Yes [provider]  alum hydroxide-mag trisilicate (GAVISCON) [id number] MG CHEW chewable tablet Chew by mouth. Patient not taking: Reported on 01/24/2023    [provider]    Allergies  Allergen Reactions   Bee Venom Other (See Comments)    Serum sickness reported with insect bite. Has been treated with prednisone only in the past per patient.  Also, fire ants.  Anaphylaxis reaction.   Benzonatate Other (See Comments)    Cannot take due due to all "caine" allergies   Cellulose Shortness Of Breath   Covid-19 (Mrna) Vaccine Anaphylaxis   Lidocaine Anaphylaxis    ALL CAINES PER PATIENT.   Peppermint Oil Anaphylaxis   Shellfish Allergy Anaphylaxis and Other (See Comments)    Pt Doesn't remember   Famotidine    Omeprazole    Atorvastatin Other (See Comments)    Dementia type symptoms, pain    Repatha [Evolocumab] Hives and Itching   Rosuvastatin Other (See Comments)    Dementia type symptoms, pain     Patient Active Problem List   Diagnosis Date Noted   Essential hypertension 03/09/2022   Body mass index (BMI) of 23.0 to 23.9 in adult 01/26/2022   Vitamin D deficiency 01/12/2022   Secondary hypertension 01/12/2022   Postmenopausal state 12/29/2021   Suspected 2019 novel coronavirus infection 02/24/2021   Osteoporosis    Basal cell  adenocarcinoma    Acid reflux    Cervical spondylolysis 10/06/2020   Dyslipidemia 12/27/2018   History of skin cancer 10/03/2018   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 BMI Assessment: Current Body mass index is 23.02 kg/m.  Patient BMI currently is in the acceptable range (>=18.5 and < 25 kg/m2) . Current barriers to healthy weight management include none.  BMI Plan:  Today, Rebecca Montgomery and I have discussed methods to help address her current weight.   No new intervention is indica   Cellulitis of left lower extremity 01/26/2020   Last Assessment & Plan:  Formatting of this note might be different from the original. Mild swelling and pain on examination. No drainage. 2 other small areas just on each side of the original area.  Will treat with antibiotic. She is to notify if not improving.   Cervical cancer screening 01/24/2021   Last Assessment & Plan:  Formatting of this note might be different from the original. She requested deference of gyn exam with pap today due to right hand fx. She will call to reschedule this at a later date.   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 Pertinent Data:   Current medication includes: diet modification and exercise This patient does not have an active medication from one of the medication groupers..  Last Lipid Values Cholesterol (mg/dl)  Date Value  04/29/2019 271 (H)  07/04/2018 306 (H)   Triglycerides (mg/dl)  Date Value  04/29/2019 214 (H)  08/22/2   Flu-like symptoms 02/24/2021   GERD (gastroesophageal reflux disease) 04/03/2018   Last Assessment & Plan:  Formatting of this note might be different from the original. Assessment Chronic and stable No compliance issue noted with today visit  Plan Continue current treatment regimen Reviewed risk of GERD Reviewed principals of treatment Patient medication  Prilosec will be continued  Last Assessment & Plan:  Formatting of this note might be different from the original. Problem co   H/O total hysterectomy 08/09/2016   Headache    History of anaphylaxis 01/20/2015   History of skin cancer 10/03/2018   Last Assessment & Plan:  Formatting of this note might be different from the original. 2 small area of concern, on forehead and to the left of the nose on the bridge. No drainage. Red, irritated skin. 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 Formatting of this note might be different from the original. 3/17- genital wound culture= HSV-2   Hypercholesteremia    Hyperlipidemia  07/04/2018   Last Assessment & Plan:  Formatting of this note is different from the original. Problem Complexity:  Chronic problem/illness, stable  Pertinent Data: LDL Calculated (mg/dL)  Date Value  01/09/2020 223 (H)   Cholesterol (mg/dl)  Date Value  01/09/2020 310 (H)   HDL (mg/dl)  Date Value  01/09/2020 56.5   Triglycerides (mg/dl)  Date Value  01/09/2020 151 (H)   AST (U/L)  Date Value  01/09/2020 16      Long term use of drug 05/01/2019   Last Assessment & Plan:  Formatting of this note might be different from the original. We reviewed age, sex, and risk factor related health maintenance interventions, largely based on the current USPSTF grade A and B recommendations.these included High blood pressure screening, diet and exercise counseling and immunization review and recommendations.   Neck pain    Neuromuscular disorder (HCC)    fibromyalgia   Nonintractable headache 08/05/2018   Osteoporosis    Palpitations 12/27/2018   Paresthesia 10/06/2020   Postmenopausal state 12/29/2021   Screen for colon cancer 07/06/2018   Last Assessment & Plan:  Formatting of this note might be  different from the original. FOBT cards given   Serum sickness 05/06/2020   Last Assessment & Plan:  Formatting of this note might be different from the original. She is doing very well and is followed closely with [facility]  Last Assessment & Plan:  Formatting of this note might be different from the original. Doing well and follows with [facility].   Sore throat 02/24/2021   Suspected 2019 novel coronavirus infection 02/24/2021   Tinnitus 01/12/2022   Vaccine counseling 01/26/2022   Last Assessment & Plan:  Formatting of this note might be different from the original. Advised to get the pneumonia vaccine which is recommended by the CDC's ACIP. Advised to get the Shingrix vaccine (shingles) which is also recommended by the CDC's ACIP, which may or may not be covered by her Medicare part D plan and which we do not stock at this office given the uncertainty regarding reimburseme   Vertigo 01/12/2022   Vitamin D deficiency 01/12/2022   Vocal cord dysfunction 09/16/2013    Past Surgical History:  Procedure Laterality Date   ABDOMINAL HYSTERECTOMY     EYE SURGERY Right    HERNIA REPAIR     KNEE SURGERY Right    WRIST SURGERY Right    left wrist in 2023    Social History   Socioeconomic History   Marital status: Divorced    Spouse name: Not on file   Number of children: 2   Years of education: masters   Highest education level: Not on file  Occupational History   Occupation: Retired  Tobacco Use   Smoking status: Never   Smokeless tobacco: Never  Vaping Use   Vaping Use: Never used  Substance and Sexual Activity   Alcohol use: Never   Drug use: Never   Sexual activity: Not on file  Other Topics Concern   Not on file  Social History Narrative   Lives at home alone.   Right-handed.   Glass of tea in the morning.    Social Determinants of Health   Financial Resource Strain: Not on file  Food Insecurity: Not on file  Transportation Needs: Not on file   Physical Activity: Sufficiently Active (12/21/2020)   Exercise Vital Sign    Days of Exercise per Week: 5 days    Minutes of Exercise per Session: 150+  min  Stress: Stress Concern Present (11/23/2020)   [place]    Feeling of Stress : Rather much  Social Connections: Not on file  Intimate Partner Violence: Not on file    Family History  Problem Relation Age of Onset   Dementia Mother        heavy smoker   Heart failure Mother    Colon cancer Mother    Congestive Heart Failure Father    Diabetes Sister    Hyperlipidemia Sister    Diabetes Sister    Hyperlipidemia Sister    Hyperlipidemia Brother    Heart attack Brother    Stroke Brother    Seizures Brother    Stroke Brother    Hyperlipidemia Brother    Heart defect Daughter    Cancer Daughter    Colon cancer Daughter        stage 3   Stomach cancer Neg Hx    Esophageal cancer Neg Hx      Review of Systems  Constitutional: Negative.  Negative for chills and fever.  HENT: Negative.  Negative for congestion and sore throat.   Respiratory: Negative.  Negative for cough and shortness of breath.   Cardiovascular: Negative.  Negative for chest pain and palpitations.  Gastrointestinal:  Negative for abdominal pain, nausea and vomiting.  Genitourinary: Negative.  Negative for dysuria.  Skin: Negative.  Negative for rash.  Neurological:  Negative for dizziness and headaches.  All other systems reviewed and are negative.   Vitals:   02/05/23 1552  BP: 139/74  Pulse: 80  Temp: 98.2 F (36.8 C)  SpO2: 98%    Physical Exam Vitals reviewed.  Constitutional:      Appearance: Normal appearance.  HENT:     Head: Normocephalic.     Mouth/Throat:     Mouth: Mucous membranes are moist.     Pharynx: Oropharynx is clear.  Eyes:     Extraocular Movements: Extraocular movements intact.     Pupils: Pupils are equal, round, and reactive to light.   Cardiovascular:     Rate and Rhythm: Normal rate and regular rhythm.     Pulses: Normal pulses.     Heart sounds: Normal heart sounds.  Pulmonary:     Effort: Pulmonary effort is normal.     Breath sounds: Normal breath sounds.  Musculoskeletal:     Cervical back: No tenderness.  Lymphadenopathy:     Cervical: No cervical adenopathy.  Skin:    General: Skin is warm and dry.     Capillary Refill: Capillary refill takes less than 2 seconds.  Neurological:     General: No focal deficit present.     Mental Status: She is alert and oriented to person, place, and time.  Psychiatric:        Mood and Affect: Mood normal.        Behavior: Behavior normal.      ASSESSMENT & PLAN: A total of 42 minutes was spent with the patient and counseling/coordination of care regarding preparing for this visit, review of most recent office visit notes, review of multiple chronic medical conditions under management, review of all medications, stress management, prognosis, documentation, and need for follow-up.  Problem List Items Addressed This Visit       Cardiovascular and Mediastinum   Essential hypertension - Primary    Well-controlled hypertension with normal blood pressure readings at home off medications.        Relevant Orders  CBC with Differential/Platelet   Comprehensive metabolic panel   Hemoglobin A1c   TSH     Digestive   GERD (gastroesophageal reflux disease)    Stable and well-controlled Presently on omeprazole 40 mg daily and famotidine 10 mg twice a day        Other   Hyperlipidemia    Stable and well-controlled. On Zetia 10 mg daily      Situational mixed anxiety and depressive disorder    Stress management discussed Multiple factors contributing Coping well      Patient Instructions  Hypertension, Adult High blood pressure (hypertension) is when the force of blood pumping through the arteries is too strong. The arteries are the blood vessels that carry  blood from the heart throughout the body. Hypertension forces the heart to work harder to pump blood and may cause arteries to become narrow or stiff. Untreated or uncontrolled hypertension can lead to a heart attack, heart failure, a stroke, kidney disease, and other problems. A blood pressure reading consists of a higher number over a lower number. Ideally, your blood pressure should be below 120/80. The first ("top") number is called the systolic pressure. It is a measure of the pressure in your arteries as your heart beats. The second ("bottom") number is called the diastolic pressure. It is a measure of the pressure in your arteries as the heart relaxes. What are the causes? The exact cause of this condition is not known. There are some conditions that result in high blood pressure. What increases the risk? Certain factors may make you more likely to develop high blood pressure. Some of these risk factors are under your control, including: Smoking. Not getting enough exercise or physical activity. Being overweight. Having too much fat, sugar, calories, or salt (sodium) in your diet. Drinking too much alcohol. Other risk factors include: Having a personal history of heart disease, diabetes, high cholesterol, or kidney disease. Stress. Having a family history of high blood pressure and high cholesterol. Having obstructive sleep apnea. Age. The risk increases with age. What are the signs or symptoms? High blood pressure may not cause symptoms. Very high blood pressure (hypertensive crisis) may cause: Headache. Fast or irregular heartbeats (palpitations). Shortness of breath. Nosebleed. Nausea and vomiting. Vision changes. Severe chest pain, dizziness, and seizures. How is this diagnosed? This condition is diagnosed by measuring your blood pressure while you are seated, with your arm resting on a flat surface, your legs uncrossed, and your feet flat on the floor. The cuff of the blood  pressure monitor will be placed directly against the skin of your upper arm at the level of your heart. Blood pressure should be measured at least twice using the same arm. Certain conditions can cause a difference in blood pressure between your right and left arms. If you have a high blood pressure reading during one visit or you have normal blood pressure with other risk factors, you may be asked to: Return on a different day to have your blood pressure checked again. Monitor your blood pressure at home for 1 week or longer. If you are diagnosed with hypertension, you may have other blood or imaging tests to help your health care provider understand your overall risk for other conditions. How is this treated? This condition is treated by making healthy lifestyle changes, such as eating healthy foods, exercising more, and reducing your alcohol intake. You may be referred for counseling on a healthy diet and physical activity. Your health care provider may prescribe  medicine if lifestyle changes are not enough to get your blood pressure under control and if: Your systolic blood pressure is above 130. Your diastolic blood pressure is above 80. Your personal target blood pressure may vary depending on your medical conditions, your age, and other factors. Follow these instructions at home: Eating and drinking  Eat a diet that is high in fiber and potassium, and low in sodium, added sugar, and fat. An example of this eating plan is called the DASH diet. DASH stands for Dietary Approaches to Stop Hypertension. To eat this way: Eat plenty of fresh fruits and vegetables. Try to fill one half of your plate at each meal with fruits and vegetables. Eat whole grains, such as whole-wheat pasta, brown rice, or whole-grain bread. Fill about one fourth of your plate with whole grains. Eat or drink low-fat dairy products, such as skim milk or low-fat yogurt. Avoid fatty cuts of meat, processed or cured meats, and  poultry with skin. Fill about one fourth of your plate with lean proteins, such as fish, chicken without skin, beans, eggs, or tofu. Avoid pre-made and processed foods. These tend to be higher in sodium, added sugar, and fat. Reduce your daily sodium intake. Many people with hypertension should eat less than 1,500 mg of sodium a day. Do not drink alcohol if: Your health care provider tells you not to drink. You are pregnant, may be pregnant, or are planning to become pregnant. If you drink alcohol: Limit how much you have to: 0-1 drink a day for women. 0-2 drinks a day for men. Know how much alcohol is in your drink. In the [place] one drink equals one 12 oz bottle of beer (355 mL), one 5 oz glass of wine (148 mL), or one 1 oz glass of hard liquor (44 mL). Lifestyle  Work with your health care provider to maintain a healthy body weight or to lose weight. Ask what an ideal weight is for you. Get at least 30 minutes of exercise that causes your heart to beat faster (aerobic exercise) most days of the week. Activities may include walking, swimming, or biking. Include exercise to strengthen your muscles (resistance exercise), such as Pilates or lifting weights, as part of your weekly exercise routine. Try to do these types of exercises for 30 minutes at least 3 days a week. Do not use any products that contain nicotine or tobacco. These products include cigarettes, chewing tobacco, and vaping devices, such as e-cigarettes. If you need help quitting, ask your health care provider. Monitor your blood pressure at home as told by your health care provider. Keep all follow-up visits. This is important. Medicines Take over-the-counter and prescription medicines only as told by your health care provider. Follow directions carefully. Blood pressure medicines must be taken as prescribed. Do not skip doses of blood pressure medicine. Doing this puts you at risk for problems and can make the medicine less  effective. Ask your health care provider about side effects or reactions to medicines that you should watch for. Contact a health care provider if you: Think you are having a reaction to a medicine you are taking. Have headaches that keep coming back (recurring). Feel dizzy. Have swelling in your ankles. Have trouble with your vision. Get help right away if you: Develop a severe headache or confusion. Have unusual weakness or numbness. Feel faint. Have severe pain in your chest or abdomen. Vomit repeatedly. Have trouble breathing. These symptoms may be an emergency. Get help right away.  Call 911. Do not wait to see if the symptoms will go away. Do not drive yourself to the hospital. Summary Hypertension is when the force of blood pumping through your arteries is too strong. If this condition is not controlled, it may put you at risk for serious complications. Your personal target blood pressure may vary depending on your medical conditions, your age, and other factors. For most people, a normal blood pressure is less than 120/80. Hypertension is treated with lifestyle changes, medicines, or a combination of both. Lifestyle changes include losing weight, eating a healthy, low-sodium diet, exercising more, and limiting alcohol. This information is not intended to replace advice given to you by your health care provider. Make sure you discuss any questions you have with your health care provider. Document Revised: 09/06/2021 Document Reviewed: 09/06/2021 Elsevier Patient Education  [place], MD [facility]

## 2023-02-05 NOTE — Assessment & Plan Note (Signed)
Stress management discussed Multiple factors contributing Coping well

## 2023-02-05 NOTE — Assessment & Plan Note (Signed)
Well-controlled hypertension with normal blood pressure readings at home off medications.

## 2023-02-06 LAB — TSH: TSH: 1.78 u[IU]/mL (ref 0.35–5.50)

## 2023-02-08 ENCOUNTER — Telehealth: Payer: Self-pay | Admitting: Internal Medicine

## 2023-02-08 NOTE — Telephone Encounter (Signed)
Patient notified she is very happy, she thanks [place]

## 2023-02-08 NOTE — Telephone Encounter (Signed)
[name] from the [employer] patient assistance foundation program called to say patient's denial for Rebecca Montgomery has been overturned.  Patient has now been approved, prescript has been sent to the speciality pharmacy.

## 2023-02-12 NOTE — Telephone Encounter (Signed)
Called [employer] [facility] and updated staff about medication patient assistance. Advised she needs maintenance therapy and next injection is not due until 07/2023

## 2023-02-12 NOTE — Telephone Encounter (Signed)
Caller stated they will need clarification of instructions for patient's inclisiran (LEQVIO) 284 MG/1.5ML SOSY injection medication.  Caller stated they will also need to know if the patient will need a loading dose.

## 2023-02-12 NOTE — Telephone Encounter (Signed)
Message sent to Dr.[name]'s RN. 

## 2023-04-12 ENCOUNTER — Encounter: Payer: Self-pay | Admitting: Internal Medicine

## 2023-04-12 ENCOUNTER — Ambulatory Visit: Payer: Medicare PPO | Admitting: Internal Medicine

## 2023-04-12 VITALS — BP 130/80 | HR 88 | Ht 66.0 in | Wt 163.0 lb

## 2023-04-12 DIAGNOSIS — K449 Diaphragmatic hernia without obstruction or gangrene: Secondary | ICD-10-CM

## 2023-04-12 DIAGNOSIS — K219 Gastro-esophageal reflux disease without esophagitis: Secondary | ICD-10-CM | POA: Diagnosis not present

## 2023-04-12 DIAGNOSIS — K2990 Gastroduodenitis, unspecified, without bleeding: Secondary | ICD-10-CM

## 2023-04-12 DIAGNOSIS — Z800 Family history of malignant neoplasm of digestive organs: Secondary | ICD-10-CM | POA: Diagnosis not present

## 2023-04-12 DIAGNOSIS — K2970 Gastritis, unspecified, without bleeding: Secondary | ICD-10-CM

## 2023-04-12 DIAGNOSIS — K649 Unspecified hemorrhoids: Secondary | ICD-10-CM

## 2023-04-12 MED ORDER — HYDROCORTISONE (PERIANAL) 2.5 % EX CREA: 1.0000 | TOPICAL_CREAM | Freq: Two times a day (BID) | CUTANEOUS | 0 refills | Status: AC

## 2023-04-12 MED ORDER — OMEPRAZOLE 40 MG PO CPDR
40.0000 mg | DELAYED_RELEASE_CAPSULE | Freq: Two times a day (BID) | ORAL | 0 refills | Status: DC
Start: 2023-04-12 — End: 2023-07-24

## 2023-04-12 NOTE — Progress Notes (Signed)
Chief Complaint: GERD  HPI : 67 year old female with history of TBI, GERD, arthritis, vocal cord dysfunction, hiatal hernia, colon polyps, and family history of colon cancer presents for follow up of GERD  Interval History: Patient's daughter has been doing better, just finished her chemoradiation treatment and will be scheduled for surgery for her colon cancer soon. Patient has gained some weight from eating due to stress. Her GERD has been under better control with the omeprazole 40 mg BID. She has no longer had any burning or regurgitation anymore. Denies N&V. She was recently bitten by fire ants and is currently recovering from serum sickness. She will still sometimes have a cough, though this may be related to her recent serum sickness. Endorses some rectal bleeding on occasion. Denies rectal pain. Denies straining. She does sit on the toilet for long periods of time in order to help induce BMs. She will usually have one BM per day if she is able to sit on the toilet.  Wt Readings from Last 3 Encounters:  04/12/23 163 lb (73.9 kg)  02/05/23 157 lb 2 oz (71.3 kg)  01/24/23 159 lb (72.1 kg)    Past Medical History:  Diagnosis Date   Acid reflux    Acute conjunctivitis of left eye 02/24/2021   Arthritis    Atypical chest pain 12/27/2018   Back pain    Basal cell adenocarcinoma    Body mass index (BMI) of 23.0 to 23.9 in adult 01/26/2022   Last Assessment & Plan:  Formatting of this note might be different from the original. BMI Assessment: Current Body mass index is 23.02 kg/m.  Patient BMI currently is in the acceptable range (>=18.5 and < 25 kg/m2) . Current barriers to healthy weight management include none.  BMI Plan:  Today, Rebecca Montgomery and I have discussed methods to help address her current weight.   No new intervention is indica   Cellulitis of left lower extremity 01/26/2020   Last Assessment & Plan:  Formatting of this note might be different from the original. Mild swelling and  pain on examination. No drainage. 2 other small areas just on each side of the original area.  Will treat with antibiotic. She is to notify if not improving.   Cervical cancer screening 01/24/2021   Last Assessment & Plan:  Formatting of this note might be different from the original. She requested deference of gyn exam with pap today due to right hand fx. She will call to reschedule this at a later date.   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 Pertinent Data:   Current medication includes: diet modification and exercise This patient does not have an active medication from one of the medication groupers..  Last Lipid Values Cholesterol (mg/dl)  Date Value  81/19/1478 271 (H)  07/04/2018 306 (H)   Triglycerides (mg/dl)  Date Value  29/56/2130 214 (H)  08/22/2   Flu-like symptoms 02/24/2021   GERD (gastroesophageal reflux disease) 04/03/2018   Last Assessment & Plan:  Formatting of this note might be different from the original. Assessment Chronic and stable No compliance issue noted with today visit  Plan Continue current treatment regimen Reviewed risk of GERD Reviewed principals of treatment Patient medication Prilosec will be continued  Last Assessment & Plan:  Formatting of this note might be  different from the original. Problem co   H/O total hysterectomy 08/09/2016   Headache    History of anaphylaxis 01/20/2015   History of skin cancer 10/03/2018   Last Assessment & Plan:  Formatting of this note might be different from the original. 2 small area of concern, on forehead and to the left of the nose on the bridge. No drainage. Red, irritated skin. 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 Formatting of this note might be different from the original. 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 Problem Complexity:  Chronic problem/illness, stable  Pertinent Data: LDL Calculated (mg/dL)  Date Value  16/08/9603 223 (H)   Cholesterol (mg/dl)  Date Value  54/07/8118 310 (H)   HDL (mg/dl)  Date Value  14/78/2956 56.5   Triglycerides (mg/dl)  Date Value  21/30/8657 151 (H)   AST (U/L)  Date Value  01/09/2020 16      Long term use of drug 05/01/2019   Last Assessment & Plan:  Formatting of this note might be different from the original. We reviewed age, sex, and risk factor related health maintenance interventions, largely based on the current USPSTF grade A and B recommendations.these included High blood pressure screening, diet and exercise counseling and immunization review and recommendations.   Neck pain    Neuromuscular disorder (HCC)    fibromyalgia   Nonintractable headache 08/05/2018   Osteoporosis    Palpitations 12/27/2018   Paresthesia 10/06/2020   Postmenopausal state 12/29/2021   Screen for colon cancer 07/06/2018   Last Assessment & Plan:  Formatting of this note might be different from the original. FOBT cards given   Serum sickness 05/06/2020   Last Assessment & Plan:  Formatting of this note might be different from the original. She is doing very well and is followed closely with [facility]  Last Assessment & Plan:  Formatting of this note might be different from the original. Doing well and follows with [facility].   Sore throat 02/24/2021   Suspected 2019 novel coronavirus infection 02/24/2021   Tinnitus 01/12/2022   Vaccine counseling 01/26/2022   Last Assessment & Plan:  Formatting of this note might be different from the original. Advised to get  the pneumonia vaccine which is recommended by the CDC's ACIP. Advised to get the Shingrix vaccine (shingles) which is also recommended by the CDC's ACIP, which may or may not be covered by her Medicare part D plan and which we do not stock at this office given the uncertainty regarding reimburseme   Vertigo 01/12/2022   Vitamin D deficiency 01/12/2022   Vocal cord dysfunction 09/16/2013     Past Surgical History:  Procedure Laterality Date   ABDOMINAL HYSTERECTOMY     EYE SURGERY Right    HERNIA REPAIR     KNEE SURGERY Right    WRIST SURGERY Right    left wrist in 2023   Family History  Problem Relation Age of Onset   Dementia Mother        heavy smoker   Heart failure Mother    Colon cancer Mother    Congestive Heart Failure Father    Diabetes Sister    Hyperlipidemia  Sister    Diabetes Sister    Hyperlipidemia Sister    Hyperlipidemia Brother    Heart attack Brother    Stroke Brother    Seizures Brother    Stroke Brother    Hyperlipidemia Brother    Heart defect Daughter    Colon cancer Daughter        stage 3   Colon cancer Maternal Aunt    Colon cancer Paternal Uncle    Colon polyps Son    Stomach cancer Neg Hx    Esophageal cancer Neg Hx    Social History   Tobacco Use   Smoking status: Never   Smokeless tobacco: Never  Vaping Use   Vaping Use: Never used  Substance Use Topics   Alcohol use: Never   Drug use: Never   Current Outpatient Medications  Medication Sig Dispense Refill   acyclovir (ZOVIRAX) 400 MG tablet Take 1 tablet by mouth as needed.     alum hydroxide-mag trisilicate (GAVISCON) 80-20 MG CHEW chewable tablet Chew by mouth.     aspirin 81 MG tablet Take 81 mg by mouth daily.     docusate sodium (COLACE) 100 MG capsule Take 100 mg by mouth daily.     ezetimibe (ZETIA) 10 MG tablet Take 1 tablet (10 mg total) by mouth daily. 90 tablet 2   hydrocortisone (ANUSOL-HC) 2.5 % rectal cream Place 1 Application rectally 2 (two) times daily for 7  days. 21 g 0   inclisiran (LEQVIO) 284 MG/1.5ML SOSY injection Inject 284 mg into the skin once.     senna (SENOKOT) 8.6 MG tablet Take 1 tablet by mouth daily. As needed     EPINEPHrine 0.3 mg/0.3 mL IJ SOAJ injection INJECT CONTENTS OF 1 PEN INTO OUTER THIGH FOR SEVERE ALLERGIC REACTION. CALL 911 AFTER USE. (Patient not taking: Reported on 04/12/2023)  1   omeprazole (PRILOSEC) 40 MG capsule Take 1 capsule (40 mg total) by mouth 2 (two) times daily. 180 capsule 0   No current facility-administered medications for this visit.   Allergies  Allergen Reactions   Bee Venom Other (See Comments)    Serum sickness reported with insect bite. Has been treated with prednisone only in the past per patient.  Also, fire ants.  Anaphylaxis reaction.   Benzonatate Other (See Comments)    Cannot take due due to all "caine" allergies   Cellulose Shortness Of Breath   Covid-19 (Mrna) Vaccine Anaphylaxis   Lidocaine Anaphylaxis    ALL CAINES PER PATIENT.   Peppermint Oil Anaphylaxis   Shellfish Allergy Anaphylaxis and Other (See Comments)    Pt Doesn't remember   Famotidine    Omeprazole    Atorvastatin Other (See Comments)    Dementia type symptoms, pain    Repatha [Evolocumab] Hives and Itching   Rosuvastatin Other (See Comments)    Dementia type symptoms, pain      Review of Systems: All systems reviewed and negative except where noted in HPI.   Physical Exam: BP 130/80 (BP Location: Left Arm, Patient Position: Sitting, Cuff Size: Normal)   Pulse 88   Ht 5\' 6"  (1.676 m)   Wt 163 lb (73.9 kg)   BMI 26.31 kg/m  Constitutional: Pleasant,well-developed, female in no acute distress. HEENT: Normocephalic and atraumatic. Conjunctivae are normal. No scleral icterus. Cardiovascular: Normal rate, regular rhythm.  Pulmonary/chest: Effort normal and breath sounds normal. No wheezing, rales or rhonchi. Abdominal: Soft, nondistended, nontender. Bowel sounds active throughout. There are no masses  palpable. No  hepatomegaly. Extremities: No edema Neurological: Alert and oriented to person place and time. Skin: Skin is warm and dry. No rashes noted. Psychiatric: Normal mood and affect. Behavior is normal.  Labs 04/2019: CBC nml  Labs 01/2022: BMP nml. AST and ALT nml.   Ab U/S 03/26/02: IMPRESSION  NEGATIVE ABDOMINAL ULTRASOUND.  NO GALLSTONES   EGD 01/21/07: Normal examination  Colonoscopy 01/21/07: Good prep. Normal examination.  EGD 01/24/23:  Path: 1. Surgical [P], gastric antrum and gastric body - GASTRIC ANTRAL AND OXYNTIC MUCOSA WITH FEATURES OF REACTIVE GASTROPATHY - NEGATIVE FOR H. PYLORI ON H&E STAIN - NEGATIVE FOR INTESTINAL METAPLASIA OR MALIGNANCY 2. Surgical [P], gastric polyps x3, polyp (3) - FUNDIC GLAND POLYPS 3. Surgical [P], GE junction - GASTROESOPHAGEAL JUNCTIONAL MUCOSA WITH NO SPECIFIC PATHOLOGIC CHANGES - NEGATIVE FOR INTESTINAL METAPLASIA, DYSPLASIA OR MALIGNANCY 4. Surgical [P], esophagus bx - SQUAMOUS MUCOSA WITH NO SPECIFIC PATHOLOGIC CHANGES - NEGATIVE FOR INCREASED INTRAEPITHELIAL EOSINOPHILS - NEGATIVE FOR DYSPLASIA OR MALIGNANCY  Colonoscopy 01/24/23:  Path: 5. Surgical [P], colon, cecum, ascending, polyp (4) - TUBULAR ADENOMA(S). - NO HIGH GRADE DYSPLASIA OR MALIGNANCY. - SESSILE SERRATED POLYP. - NO DYSPLASIA OR MALIGNANCY. - POLYPOID FRAGMENT OF BENIGN COLONIC MUCOSA WITH LYMPHOID AGGREGATE  ASSESSMENT AND PLAN: GERD Cough Hiatal hernia Hemorrhoids History of colon polyps Family history of colon cancer Patient presents for follow up of GERD, which is now under fairly good control on PPI BID. She does still have some coughing episodes, though this may be related to her recent episode of serum sickness. Will continue her on PPI BID for now. She has described some recent rectal bleeding and was noted to have hemorrhoids on her recent colonoscopy. Thus I instructed her to try to avoid sitting on the toilet for long periods of time and  to try Anusol HC cream. She has a strong family history of colon cancer so will refer her to get genetic testing.  - Cont omeprazole 40 mg BID. Refill - Anusol HC cream BID for 7 days - Recall for repeat colonoscopy in 01/2026 for history of colon polyps - Referral to genetics due to strong family history of colon cancer - RTC in 3 months  Eulah Pont, MD  I spent 40 minutes of time, including in depth chart review, independent review of results as outlined above, communicating results with the patient directly, face-to-face time with the patient, coordinating care, ordering studies and medications as appropriate, and documentation.

## 2023-04-12 NOTE — Patient Instructions (Addendum)
We have sent the following medications to your pharmacy for you to pick up at your convenience: Anusol,Omeprazole   A referral was placed for Genetics they will contact you to schedule appointment  You are schedule for a follow up visit on 07/24/23 at 2:10 pm  If your blood pressure at your visit was 140/90 or greater, please contact your primary care physician to follow up on this.    If you are age 67 or older, your body mass index should be between 23-30. Your Body mass index is 26.31 kg/m. If this is out of the aforementioned range listed, please consider follow up with your Primary Care Provider.  If you are age 80 or younger, your body mass index should be between 19-25. Your Body mass index is 26.31 kg/m. If this is out of the aformentioned range listed, please consider follow up with your Primary Care Provider.    The [facility] providers would like to encourage you to use [facility] to communicate with providers for non-urgent requests or questions.  Due to long hold times on the telephone, sending your provider a message by [facility] may be a faster and more efficient way to get a response.  Please allow 48 business hours for a response.  Please remember that this is for non-urgent requests.    Thank you for entrusting me with your care and for choosing [facility], Dr. Eulah Pont

## 2023-04-17 ENCOUNTER — Telehealth: Payer: Self-pay | Admitting: Genetic Counselor

## 2023-04-17 NOTE — Telephone Encounter (Signed)
Patient is aware of upcoming appoinments dates/times.

## 2023-04-23 ENCOUNTER — Telehealth: Payer: Self-pay

## 2023-04-23 DIAGNOSIS — Z0000 Encounter for general adult medical examination without abnormal findings: Secondary | ICD-10-CM

## 2023-04-23 NOTE — Telephone Encounter (Signed)
Called to follow up on health coaching referral. Left patient a message to return call.   .[email or web address]

## 2023-05-24 ENCOUNTER — Telehealth: Payer: Self-pay | Admitting: Pharmacy Technician

## 2023-05-24 NOTE — Telephone Encounter (Signed)
Auth Submission: APPROVED - [place] RENEWAL Site of care: Site of care: [facility] Payer: [employer] Medication & CPT/J Code(s) submitted: Leqvio (Inclisiran) [id number] Route of submission (phone, fax, portal):  Phone #[phone] Fax # Auth type: Buy/Bill Units/visits requested: X1 DOSE Reference number: [id number] Approval from: 06/14/23 to 11/13/23

## 2023-06-01 ENCOUNTER — Ambulatory Visit (INDEPENDENT_AMBULATORY_CARE_PROVIDER_SITE_OTHER): Payer: Medicare PPO

## 2023-06-01 ENCOUNTER — Ambulatory Visit
Admission: RE | Admit: 2023-06-01 | Discharge: 2023-06-01 | Disposition: A | Discharge status: Home / Self Care | Payer: Medicare PPO | Source: Ambulatory Visit | Attending: Emergency Medicine | Admitting: Emergency Medicine

## 2023-06-01 VITALS — Ht 67.25 in | Wt 160.4 lb

## 2023-06-01 DIAGNOSIS — Z13820 Encounter for screening for osteoporosis: Secondary | ICD-10-CM | POA: Diagnosis not present

## 2023-06-01 DIAGNOSIS — Z780 Asymptomatic menopausal state: Secondary | ICD-10-CM

## 2023-06-01 DIAGNOSIS — Z0000 Encounter for general adult medical examination without abnormal findings: Secondary | ICD-10-CM

## 2023-06-01 DIAGNOSIS — Z1231 Encounter for screening mammogram for malignant neoplasm of breast: Secondary | ICD-10-CM

## 2023-06-01 NOTE — Patient Instructions (Addendum)
Rebecca Montgomery , Thank you for taking time to come for your Medicare Wellness Visit. I appreciate your ongoing commitment to your health goals. Please review the following plan we discussed and let me know if I can assist you in the future.  It was a pleasure meeting you today.  These are the goals we discussed:  Goals       DIET - Healthy Eating Habits (pt-stated)      Patient expressed that she would like to lose at least 10 lb.        This is a list of the screening recommended for you and due dates:  Health Maintenance  Topic Date Due   Hepatitis C Screening  Never done   Zoster (Shingles) Vaccine (1 of 2) Never done   Mammogram  02/13/2013   COVID-19 Vaccine (2 - Moderna risk series) 02/09/2020   DEXA scan (bone density measurement)  Never done   Pneumonia Vaccine (1 of 1 - PCV) 06/15/2023*   Flu Shot  06/14/2023   Medicare Annual Wellness Visit  05/31/2024   DTaP/Tdap/Td vaccine (2 - Td or Tdap) 12/11/2024   Colon Cancer Screening  01/24/2028   HPV Vaccine  Aged Out  *Topic was postponed. The date shown is not the original due date.    Advanced directives: Please bring a copy of your health care power of attorney and living will to the office to be added to your chart at your convenience.   Conditions/risks identified: Keep up the good work.  Aim for 30 minutes of exercise or brisk walking, (but do what you your body will allow you), 6-8 glasses of water, and 5 servings of fruits and vegetables each day.  Remember to call [place] at [facility] to schedule for your Bone Density screening, [phone]) if you do not here from them in the next week or so.  Next appointment: Follow up in one year for your annual wellness visit    Preventive Care 65 Years and Older, Female Preventive care refers to lifestyle choices and visits with your health care provider that can promote health and wellness. What does preventive care include? A yearly physical exam. This is also called an annual  well check. Dental exams once or twice a year. Routine eye exams. Ask your health care provider how often you should have your eyes checked. Personal lifestyle choices, including: Daily care of your teeth and gums. Regular physical activity. Eating a healthy diet. Avoiding tobacco and drug use. Limiting alcohol use. Practicing safe sex. Taking low-dose aspirin every day. Taking vitamin and mineral supplements as recommended by your health care provider. What happens during an annual well check? The services and screenings done by your health care provider during your annual well check will depend on your age, overall health, lifestyle risk factors, and family history of disease. Counseling  Your health care provider may ask you questions about your: Alcohol use. Tobacco use. Drug use. Emotional well-being. Home and relationship well-being. Sexual activity. Eating habits. History of falls. Memory and ability to understand (cognition). Work and work Astronomer. Reproductive health. Screening  You may have the following tests or measurements: Height, weight, and BMI. Blood pressure. Lipid and cholesterol levels. These may be checked every 5 years, or more frequently if you are over 67 years old. Skin check. Lung cancer screening. You may have this screening every year starting at age 67 if you have a 30-pack-year history of smoking and currently smoke or have quit within the past  15 years. Fecal occult blood test (FOBT) of the stool. You may have this test every year starting at age 67. Flexible sigmoidoscopy or colonoscopy. You may have a sigmoidoscopy every 5 years or a colonoscopy every 10 years starting at age 67. Hepatitis C blood test. Hepatitis B blood test. Sexually transmitted disease (STD) testing. Diabetes screening. This is done by checking your blood sugar (glucose) after you have not eaten for a while (fasting). You may have this done every 1-3 years. Bone density  scan. This is done to screen for osteoporosis. You may have this done starting at age 67. Mammogram. This may be done every 1-2 years. Talk to your health care provider about how often you should have regular mammograms. Talk with your health care provider about your test results, treatment options, and if necessary, the need for more tests. Vaccines  Your health care provider may recommend certain vaccines, such as: Influenza vaccine. This is recommended every year. Tetanus, diphtheria, and acellular pertussis (Tdap, Td) vaccine. You may need a Td booster every 10 years. Zoster vaccine. You may need this after age 67. Pneumococcal 13-valent conjugate (PCV13) vaccine. One dose is recommended after age 67. Pneumococcal polysaccharide (PPSV23) vaccine. One dose is recommended after age 67. Talk to your health care provider about which screenings and vaccines you need and how often you need them. This information is not intended to replace advice given to you by your health care provider. Make sure you discuss any questions you have with your health care provider. Document Released: 11/26/2015 Document Revised: 07/19/2016 Document Reviewed: 08/31/2015 Elsevier Interactive Patient Education  2017 [employer].  Fall Prevention in the Home Falls can cause injuries. They can happen to people of all ages. There are many things you can do to make your home safe and to help prevent falls. What can I do on the outside of my home? Regularly fix the edges of walkways and driveways and fix any cracks. Remove anything that might make you trip as you walk through a door, such as a raised step or threshold. Trim any bushes or trees on the path to your home. Use bright outdoor lighting. Clear any walking paths of anything that might make someone trip, such as rocks or tools. Regularly check to see if handrails are loose or broken. Make sure that both sides of any steps have handrails. Any raised decks and  porches should have guardrails on the edges. Have any leaves, snow, or ice cleared regularly. Use sand or salt on walking paths during winter. Clean up any spills in your garage right away. This includes oil or grease spills. What can I do in the bathroom? Use night lights. Install grab bars by the toilet and in the tub and shower. Do not use towel bars as grab bars. Use non-skid mats or decals in the tub or shower. If you need to sit down in the shower, use a plastic, non-slip stool. Keep the floor dry. Clean up any water that spills on the floor as soon as it happens. Remove soap buildup in the tub or shower regularly. Attach bath mats securely with double-sided non-slip rug tape. Do not have throw rugs and other things on the floor that can make you trip. What can I do in the bedroom? Use night lights. Make sure that you have a light by your bed that is easy to reach. Do not use any sheets or blankets that are too big for your bed. They should not hang  down onto the floor. Have a firm chair that has side arms. You can use this for support while you get dressed. Do not have throw rugs and other things on the floor that can make you trip. What can I do in the kitchen? Clean up any spills right away. Avoid walking on wet floors. Keep items that you use a lot in easy-to-reach places. If you need to reach something above you, use a strong step stool that has a grab bar. Keep electrical cords out of the way. Do not use floor polish or wax that makes floors slippery. If you must use wax, use non-skid floor wax. Do not have throw rugs and other things on the floor that can make you trip. What can I do with my stairs? Do not leave any items on the stairs. Make sure that there are handrails on both sides of the stairs and use them. Fix handrails that are broken or loose. Make sure that handrails are as long as the stairways. Check any carpeting to make sure that it is firmly attached to the  stairs. Fix any carpet that is loose or worn. Avoid having throw rugs at the top or bottom of the stairs. If you do have throw rugs, attach them to the floor with carpet tape. Make sure that you have a light switch at the top of the stairs and the bottom of the stairs. If you do not have them, ask someone to add them for you. What else can I do to help prevent falls? Wear shoes that: Do not have high heels. Have rubber bottoms. Are comfortable and fit you well. Are closed at the toe. Do not wear sandals. If you use a stepladder: Make sure that it is fully opened. Do not climb a closed stepladder. Make sure that both sides of the stepladder are locked into place. Ask someone to hold it for you, if possible. Clearly mark and make sure that you can see: Any grab bars or handrails. First and last steps. Where the edge of each step is. Use tools that help you move around (mobility aids) if they are needed. These include: Canes. Walkers. Scooters. Crutches. Turn on the lights when you go into a dark area. Replace any light bulbs as soon as they burn out. Set up your furniture so you have a clear path. Avoid moving your furniture around. If any of your floors are uneven, fix them. If there are any pets around you, be aware of where they are. Review your medicines with your doctor. Some medicines can make you feel dizzy. This can increase your chance of falling. Ask your doctor what other things that you can do to help prevent falls. This information is not intended to replace advice given to you by your health care provider. Make sure you discuss any questions you have with your health care provider. Document Released: 08/26/2009 Document Revised: 04/06/2016 Document Reviewed: 12/04/2014 Elsevier Interactive Patient Education  2017 [employer].

## 2023-06-01 NOTE — Progress Notes (Signed)
Subjective:   Rebecca Montgomery is a 67 y.o. female who presents for an Initial Medicare Annual Wellness Visit.  Visit Complete: In person  Review of Systems    Cardiac Risk Factors include: advanced age (>39men, >34 women);hypertension;dyslipidemia;Other (see comment)     Objective:    Today's Vitals   06/01/23 0917 06/01/23 0920  Weight: 160 lb 6.4 oz (72.8 kg)   Height: 5' 7.25" (1.708 m)   PainSc:  5    Body mass index is 24.94 kg/m.     06/01/2023    9:30 AM  Advanced Directives  Does Patient Have a Medical Advance Directive? Yes  Type of Estate agent of [place];Living will  Copy of Healthcare Power of Attorney in Chart? No - copy requested    Current Medications (verified) Outpatient Encounter Medications as of 06/01/2023  Medication Sig   acyclovir (ZOVIRAX) 400 MG tablet Take 1 tablet by mouth as needed.   alum hydroxide-mag trisilicate (GAVISCON) 80-20 MG CHEW chewable tablet Chew by mouth.   aspirin 81 MG tablet Take 81 mg by mouth daily.   docusate sodium (COLACE) 100 MG capsule Take 100 mg by mouth daily as needed for moderate constipation.   EPINEPHrine 0.3 mg/0.3 mL IJ SOAJ injection    ezetimibe (ZETIA) 10 MG tablet Take 1 tablet (10 mg total) by mouth daily.   inclisiran (LEQVIO) 284 MG/1.5ML SOSY injection Inject 284 mg into the skin once.   omeprazole (PRILOSEC) 40 MG capsule Take 1 capsule (40 mg total) by mouth 2 (two) times daily.   senna (SENOKOT) 8.6 MG tablet Take 1 tablet by mouth daily. As needed   No facility-administered encounter medications on file as of 06/01/2023.    Allergies (verified) Bee venom, Benzonatate, Cellulose, Covid-19 (mrna) vaccine, Lidocaine, Peppermint oil, Shellfish allergy, Famotidine, Omeprazole, Atorvastatin, Repatha [evolocumab], and Rosuvastatin   History: Past Medical History:  Diagnosis Date   Acid reflux    Acute conjunctivitis of left eye 02/24/2021   Arthritis    Atypical chest pain  12/27/2018   Back pain    Basal cell adenocarcinoma    Body mass index (BMI) of 23.0 to 23.9 in adult 01/26/2022   Last Assessment & Plan:  Formatting of this note might be different from the original. BMI Assessment: Current Body mass index is 23.02 kg/m.  Patient BMI currently is in the acceptable range (>=18.5 and < 25 kg/m2) . Current barriers to healthy weight management include none.  BMI Plan:  Today, Alitzel and I have discussed methods to help address her current weight.   No new intervention is indica   Cellulitis of left lower extremity 01/26/2020   Last Assessment & Plan:  Formatting of this note might be different from the original. Mild swelling and pain on examination. No drainage. 2 other small areas just on each side of the original area.  Will treat with antibiotic. She is to notify if not improving.   Cervical cancer screening 01/24/2021   Last Assessment & Plan:  Formatting of this note might be different from the original. She requested deference of gyn exam with pap today due to right hand fx. She will call to reschedule this at a later date.   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 Pertinent Data:   Current medication includes: diet  modification and exercise This patient does not have an active medication from one of the medication groupers..  Last Lipid Values Cholesterol (mg/dl)  Date Value  86/57/8469 271 (H)  07/04/2018 306 (H)   Triglycerides (mg/dl)  Date Value  62/95/2841 214 (H)  08/22/2   Flu-like symptoms 02/24/2021   GERD (gastroesophageal reflux disease) 04/03/2018   Last Assessment & Plan:  Formatting of this note might be different from the original. Assessment Chronic and stable No compliance issue noted with today visit  Plan Continue current treatment regimen Reviewed  risk of GERD Reviewed principals of treatment Patient medication Prilosec will be continued  Last Assessment & Plan:  Formatting of this note might be different from the original. Problem co   H/O total hysterectomy 08/09/2016   Headache    History of anaphylaxis 01/20/2015   History of skin cancer 10/03/2018   Last Assessment & Plan:  Formatting of this note might be different from the original. 2 small area of concern, on forehead and to the left of the nose on the bridge. No drainage. Red, irritated skin. 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 Formatting of this note might be different from the original. 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 Problem Complexity:  Chronic problem/illness, stable  Pertinent Data: LDL Calculated (mg/dL)  Date Value  32/44/0102 223 (H)   Cholesterol (mg/dl)  Date Value  72/53/6644 310 (H)   HDL (mg/dl)  Date Value  03/47/4259 56.5   Triglycerides (mg/dl)  Date Value  56/38/7564 151 (H)   AST (U/L)  Date Value  01/09/2020 16      Long term use of drug 05/01/2019   Last Assessment & Plan:  Formatting of this note might be different from the original. We reviewed age, sex, and risk factor related health maintenance interventions, largely based on the current USPSTF grade A and B recommendations.these included High blood pressure screening, diet and exercise counseling and immunization review and recommendations.   Neck pain    Neuromuscular disorder (HCC)    fibromyalgia   Nonintractable headache 08/05/2018   Osteoporosis    Palpitations 12/27/2018   Paresthesia 10/06/2020   Postmenopausal state 12/29/2021   Screen for colon cancer 07/06/2018    Last Assessment & Plan:  Formatting of this note might be different from the original. FOBT cards given   Serum sickness 05/06/2020   Last Assessment & Plan:  Formatting of this note might be different from the original. She is doing very well and is followed closely with [facility]  Last Assessment & Plan:  Formatting of this note might be different from the original. Doing well and follows with [facility].   Sore throat 02/24/2021   Suspected 2019 novel coronavirus infection 02/24/2021   Tinnitus 01/12/2022   Vaccine counseling 01/26/2022   Last Assessment & Plan:  Formatting of this note might be different from the original. Advised to get the pneumonia vaccine which is recommended by the CDC's ACIP. Advised to get the Shingrix vaccine (shingles) which is also recommended by the CDC's ACIP, which may or may not be covered by her Medicare part D plan and which we do not stock at this  office given the uncertainty regarding reimburseme   Vertigo 01/12/2022   Vitamin D deficiency 01/12/2022   Vocal cord dysfunction 09/16/2013   Past Surgical History:  Procedure Laterality Date   ABDOMINAL HYSTERECTOMY     EYE SURGERY Right    HERNIA REPAIR     KNEE SURGERY Right    WRIST SURGERY Right    left wrist in 2023   Family History  Problem Relation Age of Onset   Dementia Mother        heavy smoker   Heart failure Mother    Colon cancer Mother    Congestive Heart Failure Father    Diabetes Sister    Hyperlipidemia Sister    Diabetes Sister    Hyperlipidemia Sister    Hyperlipidemia Brother    Heart attack Brother    Stroke Brother    Seizures Brother    Stroke Brother    Hyperlipidemia Brother    Heart defect Daughter    Colon cancer Daughter        stage 3   Colon cancer Maternal Aunt    Colon cancer Paternal Uncle    Colon polyps Son    Stomach cancer Neg Hx    Esophageal cancer Neg Hx    Social History   Socioeconomic History   Marital status: Divorced     Spouse name: Not on file   Number of children: 2   Years of education: masters   Highest education level: Not on file  Occupational History   Occupation: Retired  Tobacco Use   Smoking status: Never   Smokeless tobacco: Never  Vaping Use   Vaping status: Never Used  Substance and Sexual Activity   Alcohol use: Never   Drug use: Never   Sexual activity: Not on file  Other Topics Concern   Not on file  Social History Narrative   Lives at home alone.   Right-handed.   Glass of tea in the morning.    Social Determinants of Health   Financial Resource Strain: Low Risk  (06/01/2023)   Overall Financial Resource Strain (CARDIA)    Difficulty of Paying Living Expenses: Not hard at all  Food Insecurity: No Food Insecurity (06/01/2023)   Hunger Vital Sign    Worried About Running Out of Food in the Last Year: Never true    Ran Out of Food in the Last Year: Never true  Transportation Needs: No Transportation Needs (06/01/2023)   PRAPARE - Administrator, Civil Service (Medical): No    Lack of Transportation (Non-Medical): No  Physical Activity: Sufficiently Active (06/01/2023)   Exercise Vital Sign    Days of Exercise per Week: 7 days    Minutes of Exercise per Session: 60 min  Stress: No Stress Concern Present (06/01/2023)   [employer] of Occupational Health - Occupational Stress Questionnaire    Feeling of Stress : Not at all  Social Connections: Unknown (06/01/2023)   Social Connection and Isolation Panel [NHANES]    Frequency of Communication with Friends and Family: More than three times a week    Frequency of Social Gatherings with Friends and Family: More than three times a week    Attends Religious Services: Not on Marketing executive or Organizations: Yes    Attends Banker Meetings: More than 4 times per year    Marital Status: Divorced    Tobacco Counseling Counseling given: Not Answered   Clinical Intake:  Pre-visit  preparation completed: Yes  Pain : 0-10 Pain Score: 5      BMI - recorded: 24.94 Nutritional Status: BMI of 19-24  Normal Nutritional Risks: Nausea/ vomitting/ diarrhea Diabetes: No  How often do you need to have someone help you when you read instructions, pamphlets, or other written materials from your doctor or pharmacy?: 1 - Never  Interpreter Needed?: No  Information entered by :: Fatmata Legere, RMA   Activities of Daily Living    06/01/2023    9:23 AM 06/01/2023    9:14 AM  In your present state of health, do you have any difficulty performing the following activities:  Hearing? 1 1  Vision? 0 0  Difficulty concentrating or making decisions? 0 0  Walking or climbing stairs? 0 0  Dressing or bathing? 0 0  Doing errands, shopping? 0 0  Preparing Food and eating ? N N  Using the Toilet? N N  In the past six months, have you accidently leaked urine? Y Y  Do you have problems with loss of bowel control? N N  Managing your Medications? N N  Managing your Finances? N N  Housekeeping or managing your Housekeeping? N N    Patient Care Team: Georgina Quint, MD as PCP - General (Internal Medicine) Lunette Stands, MD as Referring Physician (Orthopedic Surgery) Renaee Munda (Cardiology) Rennis Golden [name], MD as Consulting Physician (Cardiology) Georgeanna Lea, MD as Consulting Physician (Cardiology) [facility]  Indicate any recent Medical Services you may have received from other than [facility] providers in the past year (date may be approximate).     Assessment:   This is a routine wellness examination for Rebecca Montgomery.  Hearing/Vision screen Hearing Screening - Comments:: Yes.  Hearing loss. She has hearing aides but only uses one on the left side. Vision Screening - Comments:: Wears eyeglasses.  Dietary issues and exercise activities discussed:     Goals Addressed               This Visit's Progress     DIET - Healthy Eating Habits  (pt-stated)   On track     Patient expressed that she would like to lose at least 10 lb.      Depression Screen    06/01/2023    9:40 AM 02/05/2023    3:53 PM 06/14/2022    1:09 PM  PHQ 2/9 Scores  PHQ - 2 Score 0 0 0  PHQ- 9 Score 1      Fall Risk    06/01/2023    9:32 AM 06/01/2023    9:14 AM 02/05/2023    3:53 PM 06/14/2022    1:08 PM  Fall Risk   Falls in the past year? 0 0 0 0  Number falls in past yr: 0  0   Injury with Fall? 0  0   Risk for fall due to : No Fall Risks  No Fall Risks   Follow up Falls prevention discussed  Falls evaluation completed     MEDICARE RISK AT HOME:   TIMED UP AND GO:  Was the test performed? Yes  Length of time to ambulate 10 feet: 15 sec Gait slow and steady without use of assistive device    Cognitive Function:    08/05/2018   10:30 AM  MMSE - Mini Mental State Exam  Orientation to time 5  Orientation to Place 5  Registration 3  Attention/ Calculation 5  Recall 2  Language- name 2 objects 2  Language- repeat 1  Language- follow 3 step command 3  Language- read & follow direction 1  Write a sentence 1  Copy design 1  Total score 29        06/01/2023    9:33 AM  6CIT Screen  What Year? 0 points  What month? 0 points  What time? 0 points  Count back from 20 0 points  Months in reverse 0 points  Repeat phrase 0 points  Total Score 0 points    Immunizations Immunization History  Administered Date(s) Administered   Moderna Sars-Covid-2 Vaccination 01/12/2020   PPD Test 10/07/2018   Tdap 12/11/2014    TDAP status: Up to date  Flu Vaccine status: Declined, Education has been provided regarding the importance of this vaccine but patient still declined. Advised may receive this vaccine at local pharmacy or Health Dept. Aware to provide a copy of the vaccination record if obtained from local pharmacy or Health Dept. Verbalized acceptance and understanding.  Pneumococcal vaccine status: Declined,  Education has been  provided regarding the importance of this vaccine but patient still declined. Advised may receive this vaccine at local pharmacy or Health Dept. Aware to provide a copy of the vaccination record if obtained from local pharmacy or Health Dept. Verbalized acceptance and understanding.   Covid-19 vaccine status: Declined, Education has been provided regarding the importance of this vaccine but patient still declined. Advised may receive this vaccine at local pharmacy or Health Dept.or vaccine clinic. Aware to provide a copy of the vaccination record if obtained from local pharmacy or Health Dept. Verbalized acceptance and understanding.  Qualifies for Shingles Vaccine? No   Zostavax completed No   Shingrix Completed?: No.    Education has been provided regarding the importance of this vaccine. Patient has been advised to call insurance company to determine out of pocket expense if they have not yet received this vaccine. Advised may also receive vaccine at local pharmacy or Health Dept. Verbalized acceptance and understanding.  Screening Tests Health Maintenance  Topic Date Due   Hepatitis C Screening  Never done   Zoster Vaccines- Shingrix (1 of 2) Never done   MAMMOGRAM  02/13/2013   COVID-19 Vaccine (2 - Moderna risk series) 02/09/2020   DEXA SCAN  Never done   Pneumonia Vaccine 40+ Years old (1 of 1 - PCV) 06/15/2023 (Originally 04/30/2021)   INFLUENZA VACCINE  06/14/2023   Medicare Annual Wellness (AWV)  05/31/2024   DTaP/Tdap/Td (2 - Td or Tdap) 12/11/2024   Colonoscopy  01/24/2028   HPV VACCINES  Aged Out    Health Maintenance  Health Maintenance Due  Topic Date Due   Hepatitis C Screening  Never done   Zoster Vaccines- Shingrix (1 of 2) Never done   MAMMOGRAM  02/13/2013   COVID-19 Vaccine (2 - Moderna risk series) 02/09/2020   DEXA SCAN  Never done    Colorectal cancer screening: Type of screening: Colonoscopy. Completed 01/24/2023. Repeat every 5 years  Mammogram status:  Completed 06/01/2023. Repeat every year.  Bone Density status: Ordered 06/01/2023. Pt provided with contact info and advised to call to schedule appt.  Lung Cancer Screening: (Low Dose CT Chest recommended if Age 24-80 years, 20 pack-year currently smoking OR have quit w/in 15years.) does not qualify.   Lung Cancer Screening Referral: N/A  Additional Screening:  Hepatitis C Screening: does qualify; Patient believes she has had this in her past, as it is not in her chart.  She will request records from her  previous care team.  Vision Screening: Recommended annual ophthalmology exams for early detection of glaucoma and other disorders of the eye. Is the patient up to date with their annual eye exam?  No  Who is the provider or what is the name of the office in which the patient attends annual eye exams? [facility] If pt is not established with a provider, would they like to be referred to a provider to establish care? No .   Dental Screening: Recommended annual dental exams for proper oral hygiene   Community Resource Referral / Chronic Care Management: CRR required this visit?  No   CCM required this visit?  No     Plan:     I have personally reviewed and noted the following in the patient's chart:   Medical and social history Use of alcohol, tobacco or illicit drugs  Current medications and supplements including opioid prescriptions. Patient is not currently taking opioid prescriptions. Functional ability and status Nutritional status Physical activity Advanced directives List of other physicians Hospitalizations, surgeries, and ER visits in previous 12 months Vitals Screenings to include cognitive, depression, and falls Referrals and appointments  In addition, I have reviewed and discussed with patient certain preventive protocols, quality metrics, and best practice recommendations. A written personalized care plan for preventive services as well as general preventive health  recommendations were provided to patient.     Darlena Koval L Raciel Caffrey, CMA   06/01/2023   After Visit Summary: (MyChart) Due to this being a telephonic visit, the after visit summary with patients personalized plan was offered to patient via MyChart   Nurse Notes: Patient is due for an annual eye exam, which she will schedule today.  Patient is asking for a referral to an allergist here, possibly through [facility], she is currently with [facility]. She is also getting her yearly mammogram done today.   Referral has been placed for DEXA screening.

## 2023-06-12 ENCOUNTER — Encounter: Payer: Self-pay | Admitting: Genetic Counselor

## 2023-06-12 ENCOUNTER — Inpatient Hospital Stay: Payer: Medicare PPO

## 2023-06-12 ENCOUNTER — Other Ambulatory Visit: Payer: Self-pay

## 2023-06-12 ENCOUNTER — Inpatient Hospital Stay: Payer: Medicare PPO | Admitting: Genetic Counselor

## 2023-06-12 DIAGNOSIS — Z1379 Encounter for other screening for genetic and chromosomal anomalies: Secondary | ICD-10-CM | POA: Diagnosis not present

## 2023-06-12 DIAGNOSIS — Z800 Family history of malignant neoplasm of digestive organs: Secondary | ICD-10-CM | POA: Diagnosis not present

## 2023-06-12 DIAGNOSIS — Z803 Family history of malignant neoplasm of breast: Secondary | ICD-10-CM | POA: Insufficient documentation

## 2023-06-12 DIAGNOSIS — Z808 Family history of malignant neoplasm of other organs or systems: Secondary | ICD-10-CM

## 2023-06-12 LAB — GENETIC SCREENING ORDER

## 2023-06-12 NOTE — Progress Notes (Signed)
REFERRING PROVIDER: Georgina Quint, MD [address] [place],  [place] [address]  PRIMARY PROVIDER:  Georgina Quint, MD  PRIMARY REASON FOR VISIT:  1. Family history of colon cancer   2. Family history of breast cancer    HISTORY OF PRESENT ILLNESS:   Rebecca Montgomery, a 67 y.o. female, was seen for a [facility] cancer genetics consultation at the request of Dr. Alvy Bimler due to a family history of cancer.  Rebecca Montgomery presents to clinic today to discuss the possibility of a hereditary predisposition to cancer, to discuss genetic testing, and to further clarify her future cancer risks, as well as potential cancer risks for family members.   Rebecca Montgomery reports a personal history of squamous cell carcinoma diagnosed in 2017 and basal cell carcinoma diagnosed in 2023.  RISK FACTORS:  Menarche was at age 61.  First live birth at age 17.  OCP use for approximately 6 months.  Ovaries intact: yes.  Uterus intact: no.  Menopausal status: postmenopausal.  HRT use: 0 years. Colonoscopy: yes;  01/2023- 4 tubular adenomas identified . Mammogram within the last year: yes. Number of breast biopsies: 0. Up to date with pelvic exams: yes. Any excessive radiation exposure in the past: no  Past Medical History:  Diagnosis Date   Acid reflux    Acute conjunctivitis of left eye 02/24/2021   Arthritis    Atypical chest pain 12/27/2018   Back pain    Basal cell adenocarcinoma    Body mass index (BMI) of 23.0 to 23.9 in adult 01/26/2022   Last Assessment & Plan:  Formatting of this note might be different from the original. BMI Assessment: Current Body mass index is 23.02 kg/m.  Patient BMI currently is in the acceptable range (>=18.5 and < 25 kg/m2) . Current barriers to healthy weight management include none.  BMI Plan:  Today, Penelope and I have discussed methods to help address her current weight.   No new intervention is indica   Cellulitis of left lower extremity 01/26/2020   Last  Assessment & Plan:  Formatting of this note might be different from the original. Mild swelling and pain on examination. No drainage. 2 other small areas just on each side of the original area.  Will treat with antibiotic. She is to notify if not improving.   Cervical cancer screening 01/24/2021   Last Assessment & Plan:  Formatting of this note might be different from the original. She requested deference of gyn exam with pap today due to right hand fx. She will call to reschedule this at a later date.   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 Pertinent Data:   Current medication includes: diet modification and exercise This patient does not have an active medication from one of the medication groupers..  Last Lipid Values Cholesterol (mg/dl)  Date Value  43/32/9518 271 (H)  07/04/2018 306 (H)   Triglycerides (mg/dl)  Date Value  84/16/6063 214 (H)  08/22/2   Flu-like symptoms 02/24/2021   GERD (gastroesophageal reflux disease) 04/03/2018   Last Assessment & Plan:  Formatting of this note might be different from the original. Assessment Chronic and stable No compliance issue noted with today visit  Plan Continue current treatment regimen Reviewed risk of GERD Reviewed principals of treatment Patient medication Prilosec will be  continued  Last Assessment & Plan:  Formatting of this note might be different from the original. Problem co   H/O total hysterectomy 08/09/2016   Headache    History of anaphylaxis 01/20/2015   History of skin cancer 10/03/2018   Last Assessment & Plan:  Formatting of this note might be different from the original. 2 small area of concern, on forehead and to the left of the nose on the bridge. No drainage. Red, irritated skin. 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 Formatting of this note might be different from the original. 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 Problem Complexity:  Chronic problem/illness, stable  Pertinent Data: LDL Calculated (mg/dL)  Date Value  96/02/5408 223 (H)   Cholesterol (mg/dl)  Date Value  81/19/1478 310 (H)   HDL (mg/dl)  Date Value  29/56/2130 56.5   Triglycerides (mg/dl)  Date Value  86/57/8469 151 (H)   AST (U/L)  Date Value  01/09/2020 16      Long term use of drug 05/01/2019   Last Assessment & Plan:  Formatting of this note might be different from the original. We reviewed age, sex, and risk factor related health maintenance interventions, largely based on the current USPSTF grade A and B recommendations.these included High blood pressure screening, diet and exercise counseling and immunization review and recommendations.   Neck pain    Neuromuscular disorder (HCC)    fibromyalgia   Nonintractable headache 08/05/2018   Osteoporosis    Palpitations 12/27/2018   Paresthesia 10/06/2020   Postmenopausal state 12/29/2021   Screen for colon cancer 07/06/2018   Last Assessment & Plan:  Formatting of this note might be different from the original. FOBT cards given   Serum sickness 05/06/2020   Last Assessment & Plan:  Formatting of this note might be different from the original. She is doing very well and is followed closely with [facility]  Last Assessment & Plan:  Formatting of this note might be different from the original. Doing well and follows with [facility].   Sore throat 02/24/2021   Suspected 2019 novel coronavirus infection 02/24/2021   Tinnitus 01/12/2022   Vaccine counseling 01/26/2022   Last  Assessment & Plan:  Formatting of this note might be different from the original. Advised to get the pneumonia vaccine which is recommended by the CDC's ACIP. Advised to get the Shingrix vaccine (shingles) which is also recommended by the CDC's ACIP, which may or may not be covered by her Medicare part D plan and which we do not stock at this office given the uncertainty regarding reimburseme   Vertigo 01/12/2022   Vitamin D deficiency 01/12/2022   Vocal cord dysfunction 09/16/2013    Past Surgical History:  Procedure Laterality Date   ABDOMINAL HYSTERECTOMY     EYE SURGERY Right    HERNIA REPAIR     KNEE SURGERY Right    WRIST SURGERY Right    left wrist in 2023    Social History   Socioeconomic History   Marital status: Divorced    Spouse name: Not on file   Number of children: 2   Years of education: masters   Highest education  level: Not on file  Occupational History   Occupation: Retired  Tobacco Use   Smoking status: Never   Smokeless tobacco: Never  Vaping Use   Vaping status: Never Used  Substance and Sexual Activity   Alcohol use: Never   Drug use: Never   Sexual activity: Not on file  Other Topics Concern   Not on file  Social History Narrative   Lives at home alone.   Right-handed.   Glass of tea in the morning.    Social Determinants of Health   Financial Resource Strain: Low Risk  (06/01/2023)   Overall Financial Resource Strain (CARDIA)    Difficulty of Paying Living Expenses: Not hard at all  Food Insecurity: No Food Insecurity (06/01/2023)   Hunger Vital Sign    Worried About Running Out of Food in the Last Year: Never true    Ran Out of Food in the Last Year: Never true  Transportation Needs: No Transportation Needs (06/01/2023)   PRAPARE - Administrator, Civil Service (Medical): No    Lack of Transportation (Non-Medical): No  Physical Activity: Sufficiently Active (06/01/2023)   Exercise Vital Sign    Days of Exercise per Week: 7 days     Minutes of Exercise per Session: 60 min  Stress: No Stress Concern Present (06/01/2023)   [employer] of Occupational Health - Occupational Stress Questionnaire    Feeling of Stress : Not at all  Social Connections: Unknown (06/01/2023)   Social Connection and Isolation Panel [NHANES]    Frequency of Communication with Friends and Family: More than three times a week    Frequency of Social Gatherings with Friends and Family: More than three times a week    Attends Religious Services: Not on Marketing executive or Organizations: Yes    Attends Engineer, structural: More than 4 times per year    Marital Status: Divorced     FAMILY HISTORY:  We obtained a detailed, 4-generation family history.  Significant diagnoses are listed below: Family History  Problem Relation Age of Onset   Colon cancer Mother 39   Basal cell carcinoma Mother 6   Cervical cancer Sister    Basal cell carcinoma Brother    Colon cancer Maternal Aunt 72 - 64   Colon cancer Paternal Uncle 3 - 46   Colon cancer Daughter 56       stage 3   Colon polyps Son    Breast cancer Cousin        6 maternal second cousins   Breast cancer Other        maternal first cousin once removed- female breast cancer     Rebecca Montgomery's daughter was recently diagnosed with colon cancer at age 56. Her mother was diagnosed with colon cancer at age 60, she died at age 75. She had 2 maternal aunts and 1 was diagnosed with colon cancer in her early 22s. This aunt's daughter (Rebecca Montgomery first cousin) was diagnosed with melanoma at an unknown age. Rebecca Montgomery maternal first cousin once removed (great uncle's son) was diagnosed with female breast cancer. His 6 daughters (Rebecca Montgomery second cousins) all have a history of breast cancer. Rebecca Montgomery paternal uncle was diagnosed with colon cancer in his 73s. Rebecca Montgomery reports her daughter had genetic testing but she does not know the results and does not feel comfortable  asking her about them at this time. There is no reported Ashkenazi Jewish ancestry.  GENETIC COUNSELING ASSESSMENT: Rebecca Montgomery is a 67 y.o. female with a family history of cancer which is somewhat suggestive of a hereditary predisposition to cancer. We, therefore, discussed and recommended the following at today's visit.   DISCUSSION: We discussed that 5 - 10% of cancer is hereditary, with most cases of hereditary colon cancer associated with Lynch Syndrome.  There are other genes that can be associated with hereditary colon cancer syndromes.  We discussed that testing is beneficial for several reasons, including knowing about other cancer risks, identifying potential screening and risk-reduction options that may be appropriate, and to understanding if other family members could be at risk for cancer and allowing them to undergo genetic testing.  We reviewed the characteristics, features and inheritance patterns of hereditary cancer syndromes. We also discussed genetic testing, including the appropriate family members to test, the process of testing, insurance coverage and turn-around-time for results. We discussed the implications of a negative, positive, carrier and/or variant of uncertain significant result. We discussed that negative results would be uninformative given that Rebecca Montgomery does not have a personal history of cancer. We recommended Rebecca Montgomery pursue genetic testing for a panel that contains genes associated with breast and colon cancer.  Rebecca Montgomery elected to have [facility] CancerNext-Expanded Panel. The CancerNext-Expanded gene panel offered by [facility] and includes sequencing, rearrangement, and RNA analysis for the following 71 genes: AIP, ALK, APC, ATM, AXIN2, BAP1, BARD1, BMPR1A, BRCA1, BRCA2, BRIP1, CDC73, CDH1, CDK4, CDKN1B, CDKN2A, CHEK2, CTNNA1, DICER1, FH, FLCN, KIF1B, LZTR1, MAX, MEN1, MET, MLH1, MSH2, MSH3, MSH6, MUTYH, NF1, NF2, NTHL1, PALB2, PHOX2B, PMS2, POT1, PRKAR1A,  PTCH1, PTEN, RAD51C, RAD51D, RB1, RET, SDHA, SDHAF2, SDHB, SDHC, SDHD, SMAD4, SMARCA4, SMARCB1, SMARCE1, STK11, SUFU, TMEM127, TP53, TSC1, TSC2, and VHL (sequencing and deletion/duplication); EGFR, EGLN1, HOXB13, KIT, MITF, PDGFRA, POLD1, and POLE (sequencing only); EPCAM and GREM1 (deletion/duplication only).   Based on Rebecca Montgomery's family history of cancer, she meets medical criteria for genetic testing. Despite that she meets criteria, she may still have an out of pocket cost. We discussed that if her out of pocket cost for testing is over $100, the laboratory will call and confirm whether she wants to proceed with testing.  If the out of pocket cost of testing is less than $100 she will be billed by the genetic testing laboratory.   We discussed that some people do not want to undergo genetic testing due to fear of genetic discrimination.  A federal law called the Genetic Information Non-Discrimination Act (GINA) of 2008 helps protect individuals against genetic discrimination based on their genetic test results.  It impacts both health insurance and employment.  With health insurance, it protects against increased premiums, being kicked off insurance or being forced to take a test in order to be insured.  For employment it protects against hiring, firing and promoting decisions based on genetic test results.  GINA does not apply to those in the [employer], those who work for companies with less than 15 employees, and new life insurance or long-term disability insurance policies.  Health status due to a cancer diagnosis is not protected under GINA.  PLAN: After considering the risks, benefits, and limitations, Rebecca Montgomery provided informed consent to pursue genetic testing and the blood sample was sent to [facility] for analysis of the CancerNext-Expanded. Results should be available within approximately 2-3 weeks' time, at which point they will be disclosed by telephone to Rebecca Montgomery, as will any  additional recommendations warranted by these results. Ms.  Montgomery will receive a summary of her genetic counseling visit and a copy of her results once available. This information will also be available in Epic.   Ms. Garma questions were answered to her satisfaction today. Our contact information was provided should additional questions or concerns arise. Thank you for the referral and allowing [place] to share in the care of your patient.   Lalla Brothers, MS, [facility] Genetic Counselor [email or web address] (P) [phone]  The patient was seen for a total of 40 minutes in face-to-face genetic counseling.  The patient was seen alone.  Drs. Pamelia Hoit and/or Mosetta Putt were available to discuss this case as needed.  _______________________________________________________________________ For Office Staff:  Number of people involved in session: 1 Was an Intern/ student involved with case: no

## 2023-06-21 ENCOUNTER — Encounter: Payer: Self-pay | Admitting: Genetic Counselor

## 2023-06-21 ENCOUNTER — Telehealth: Payer: Self-pay | Admitting: Genetic Counselor

## 2023-06-21 DIAGNOSIS — Z1379 Encounter for other screening for genetic and chromosomal anomalies: Secondary | ICD-10-CM | POA: Insufficient documentation

## 2023-06-21 NOTE — Telephone Encounter (Addendum)
I contacted Ms. Jandreau to discuss her genetic testing results. No pathogenic variants were identified in the 71 genes analyzed. Detailed clinic note to follow.  The test report has been scanned into EPIC and is located under the Molecular Pathology section of the Results Review tab.  A portion of the result report is included below for reference.   Lalla Brothers, MS, [facility] Genetic Counselor [email or web address] (P) [phone]

## 2023-06-25 ENCOUNTER — Encounter: Payer: Self-pay | Admitting: Genetic Counselor

## 2023-06-25 ENCOUNTER — Ambulatory Visit: Payer: Self-pay | Admitting: Genetic Counselor

## 2023-06-25 DIAGNOSIS — Z1379 Encounter for other screening for genetic and chromosomal anomalies: Secondary | ICD-10-CM

## 2023-06-25 NOTE — Progress Notes (Addendum)
HPI:   Rebecca Montgomery was previously seen in the  Cancer Genetics clinic due to a family history of cancer and concerns regarding a hereditary predisposition to cancer. Please refer to our prior cancer genetics clinic note for more information regarding our discussion, assessment and recommendations, at the time. Rebecca Montgomery recent genetic test results were disclosed to her, as were recommendations warranted by these results. These results and recommendations are discussed in more detail below.  CANCER HISTORY:  Oncology History   No history exists.   FAMILY HISTORY:  We obtained a detailed, 4-generation family history.  Significant diagnoses are listed below:      Family History  Problem Relation Age of Onset   Colon cancer Mother 7   Basal cell carcinoma Mother 34   Cervical cancer Sister     Basal cell carcinoma Brother     Colon cancer Maternal Aunt 51 - 64   Colon cancer Paternal Uncle 70 - 80   Colon cancer Daughter 78        stage 3   Colon polyps Son     Breast cancer Cousin          6 maternal second cousins   Breast cancer Other          maternal first cousin once removed- female breast cancer       Rebecca Montgomery's daughter was recently diagnosed with colon cancer at age 44. Her mother was diagnosed with colon cancer at age 17, she died at age 33. She had 2 maternal aunts and 1 was diagnosed with colon cancer in her early 49s. This aunt's daughter (Rebecca Montgomery first cousin) was diagnosed with melanoma at an unknown age. Rebecca Montgomery maternal first cousin once removed (great uncle's son) was diagnosed with female breast cancer. His 6 daughters (Rebecca Montgomery second cousins) all have a history of breast cancer. Rebecca Montgomery paternal uncle was diagnosed with colon cancer in his 35s. Rebecca Montgomery reports her daughter had genetic testing but she does not know the results and does not feel comfortable asking her about them at this time. There is no reported Ashkenazi Jewish ancestry.    GENETIC TEST RESULTS:  The [facility] CancerNext-Expanded Panel found no pathogenic mutations.  The CancerNext-Expanded gene panel offered by [facility] and includes sequencing, rearrangement, and RNA analysis for the following 71 genes: AIP, ALK, APC, ATM, AXIN2, BAP1, BARD1, BMPR1A, BRCA1, BRCA2, BRIP1, CDC73, CDH1, CDK4, CDKN1B, CDKN2A, CHEK2, CTNNA1, DICER1, FH, FLCN, KIF1B, LZTR1, MAX, MEN1, MET, MLH1, MSH2, MSH3, MSH6, MUTYH, NF1, NF2, NTHL1, PALB2, PHOX2B, PMS2, POT1, PRKAR1A, PTCH1, PTEN, RAD51C, RAD51D, RB1, RET, SDHA, SDHAF2, SDHB, SDHC, SDHD, SMAD4, SMARCA4, SMARCB1, SMARCE1, STK11, SUFU, TMEM127, TP53, TSC1, TSC2, and VHL (sequencing and deletion/duplication); EGFR, EGLN1, HOXB13, KIT, MITF, PDGFRA, POLD1, and POLE (sequencing only); EPCAM and GREM1 (deletion/duplication only).    The test report has been scanned into EPIC and is located under the Molecular Pathology section of the Results Review tab.  A portion of the result report is included below for reference. Genetic testing reported out on 06/20/2023.       Even though a pathogenic variant was not identified, possible explanations for the cancer in the family may include: There may be no hereditary risk for cancer in the family. The cancers in her family may be due to other genetic or environmental factors. There may be a gene mutation in one of these genes that current testing methods cannot detect, but that chance is small. There could  be another gene that has not yet been discovered, or that we have not yet tested, that is responsible for the cancer diagnoses in the family.  It is also possible there is a hereditary cause for the cancer in the family that Rebecca Montgomery did not inherit.  Therefore, it is important to remain in touch with cancer genetics in the future so that we can continue to offer Rebecca Montgomery the most up to date genetic testing.  ADDITIONAL GENETIC TESTING:  We discussed with Rebecca Montgomery that her genetic  testing was fairly extensive.  If there are genes identified to increase cancer risk that can be analyzed in the future, we would be happy to discuss and coordinate this testing at that time.    CANCER SCREENING RECOMMENDATIONS:  Rebecca Montgomery test result is considered negative (normal).  This means that we have not identified a hereditary cause for her family history of cancer at this time.   An individual's cancer risk and medical management are not determined by genetic test results alone. Overall cancer risk assessment incorporates additional factors, including personal medical history, family history, and any available genetic information that may result in a personalized plan for cancer prevention and surveillance. Therefore, it is recommended she continue to follow the cancer management and screening guidelines provided by her primary healthcare provider.  Colon Cancer Screening: Due to Rebecca Montgomery's family history of colon cancer, she is recommended to repeat colonoscopies every 5 years. More frequent colonoscopies may be recommended if polyps are identified.  RECOMMENDATIONS FOR FAMILY MEMBERS:   Since she did not inherit a mutation in a cancer predisposition gene included on this panel, her children could not have inherited a mutation from her in one of these genes. Other members of the family may still carry a pathogenic variant in one of these genes that Rebecca Montgomery did not inherit. Due to her daughter's history of colon cancer, we recommend her son have genetic counseling and testing unless her daughter had negative genetic testing.   FOLLOW-UP:  Cancer genetics is a rapidly advancing field and it is possible that new genetic tests will be appropriate for her and/or her family members in the future. We encouraged her to remain in contact with cancer genetics on an annual basis so we can update her personal and family histories and let her know of advances in cancer genetics that may benefit  this family.   Our contact number was provided. Rebecca Montgomery questions were answered to her satisfaction, and she knows she is welcome to call us at anytime with additional questions or concerns.   Lalla Brothers, MS, [facility] Genetic Counselor [email or web address] (P) [phone]

## 2023-06-28 ENCOUNTER — Encounter (INDEPENDENT_AMBULATORY_CARE_PROVIDER_SITE_OTHER): Payer: Self-pay

## 2023-07-05 ENCOUNTER — Other Ambulatory Visit: Payer: Self-pay | Admitting: *Deleted

## 2023-07-05 DIAGNOSIS — E7849 Other hyperlipidemia: Secondary | ICD-10-CM

## 2023-07-23 ENCOUNTER — Ambulatory Visit (INDEPENDENT_AMBULATORY_CARE_PROVIDER_SITE_OTHER): Payer: Medicare PPO

## 2023-07-23 VITALS — BP 150/73 | HR 66 | Temp 97.8°F | Resp 16 | Ht 68.0 in | Wt 156.6 lb

## 2023-07-23 DIAGNOSIS — E7849 Other hyperlipidemia: Secondary | ICD-10-CM

## 2023-07-23 MED ORDER — INCLISIRAN SODIUM 284 MG/1.5ML ~~LOC~~ SOSY
284.0000 mg | PREFILLED_SYRINGE | Freq: Once | SUBCUTANEOUS | Status: AC
  Administered 2023-07-23: 284 mg via SUBCUTANEOUS

## 2023-07-23 NOTE — Progress Notes (Signed)
Diagnosis: Hyperlipidemia  Provider:  Chilton Greathouse MD  Procedure: Injection  Leqvio (inclisiran), Dose: 284 mg, Site: subcutaneous, Number of injections: 1  Administered in left arm.  Post Care: Patient declined observation  Discharge: Condition: Good, Destination: Home . AVS Provided  Performed by:  Wyvonne Lenz, RN

## 2023-07-24 ENCOUNTER — Other Ambulatory Visit: Payer: Self-pay | Admitting: Internal Medicine

## 2023-07-24 ENCOUNTER — Ambulatory Visit: Payer: Medicare PPO | Admitting: Internal Medicine

## 2023-07-24 ENCOUNTER — Encounter: Payer: Self-pay | Admitting: Internal Medicine

## 2023-07-24 VITALS — BP 136/80 | HR 71 | Ht 67.25 in | Wt 157.0 lb

## 2023-07-24 DIAGNOSIS — K219 Gastro-esophageal reflux disease without esophagitis: Secondary | ICD-10-CM | POA: Diagnosis not present

## 2023-07-24 DIAGNOSIS — Z860100 Personal history of colon polyps, unspecified: Secondary | ICD-10-CM

## 2023-07-24 DIAGNOSIS — K449 Diaphragmatic hernia without obstruction or gangrene: Secondary | ICD-10-CM | POA: Diagnosis not present

## 2023-07-24 DIAGNOSIS — Z800 Family history of malignant neoplasm of digestive organs: Secondary | ICD-10-CM

## 2023-07-24 NOTE — Progress Notes (Signed)
Chief Complaint: GERD  HPI : 67 year old female with history of TBI, GERD, arthritis, vocal cord dysfunction, hiatal hernia, colon polyps, and family history of colon cancer presents for follow up of GERD  Interval History: She has been in [place] with her daughter over the summer. Her daughter is now cancer free.  Chemotherapy was effective for her daughter's colon cancer. Patient's daughter had her ileostomy reversed and is recovering from that surgery. Patient ran out of the 40 mg BID omeprazole so she switched to 20 mg BID and her reflux is doing well on this dosage. If she sits for long periods of time, then she will see more bleeding. She knows how to deal with the bleeding due to the hemorrhoids now. She has been more active and has lost a few pounds. Her genetic testing was negative for any genetic syndromes, which was a big relief to her  Wt Readings from Last 3 Encounters:  07/24/23 157 lb (71.2 kg)  07/23/23 156 lb 9.6 oz (71 kg)  06/01/23 160 lb 6.4 oz (72.8 kg)     Past Medical History:  Diagnosis Date   Acid reflux    Acute conjunctivitis of left eye 02/24/2021   Arthritis    Atypical chest pain 12/27/2018   Back pain    Basal cell adenocarcinoma    Body mass index (BMI) of 23.0 to 23.9 in adult 01/26/2022   Last Assessment & Plan:  Formatting of this note might be different from the original. BMI Assessment: Current Body mass index is 23.02 kg/m.  Patient BMI currently is in the acceptable range (>=18.5 and < 25 kg/m2) . Current barriers to healthy weight management include none.  BMI Plan:  Today, Skyra and I have discussed methods to help address her current weight.   No new intervention is indica   Cellulitis of left lower extremity 01/26/2020   Last Assessment & Plan:  Formatting of this note might be different from the original. Mild swelling and pain on examination. No drainage. 2 other small areas just on each side of the original area.  Will treat with antibiotic.  She is to notify if not improving.   Cervical cancer screening 01/24/2021   Last Assessment & Plan:  Formatting of this note might be different from the original. She requested deference of gyn exam with pap today due to right hand fx. She will call to reschedule this at a later date.   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 Pertinent Data:   Current medication includes: diet modification and exercise This patient does not have an active medication from one of the medication groupers..  Last Lipid Values Cholesterol (mg/dl)  Date Value  87/56/4332 271 (H)  07/04/2018 306 (H)   Triglycerides (mg/dl)  Date Value  95/18/8416 214 (H)  08/22/2   Flu-like symptoms 02/24/2021   GERD (gastroesophageal reflux disease) 04/03/2018   Last Assessment & Plan:  Formatting of this note might be different from the original. Assessment Chronic and stable No compliance issue noted with today visit  Plan Continue current treatment regimen Reviewed risk of GERD Reviewed principals of treatment Patient medication Prilosec will be continued  Last Assessment & Plan:  Formatting of this note might be different from the original. Problem co   H/O total hysterectomy 08/09/2016  Headache    History of anaphylaxis 01/20/2015   History of skin cancer 10/03/2018   Last Assessment & Plan:  Formatting of this note might be different from the original. 2 small area of concern, on forehead and to the left of the nose on the bridge. No drainage. Red, irritated skin. 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 Formatting of this note might be different  from the original. 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 Problem Complexity:  Chronic problem/illness, stable  Pertinent Data: LDL Calculated (mg/dL)  Date Value  14/78/2956 223 (H)   Cholesterol (mg/dl)  Date Value  21/30/8657 310 (H)   HDL (mg/dl)  Date Value  84/69/6295 56.5   Triglycerides (mg/dl)  Date Value  28/41/3244 151 (H)   AST (U/L)  Date Value  01/09/2020 16      Long term use of drug 05/01/2019   Last Assessment & Plan:  Formatting of this note might be different from the original. We reviewed age, sex, and risk factor related health maintenance interventions, largely based on the current USPSTF grade A and B recommendations.these included High blood pressure screening, diet and exercise counseling and immunization review and recommendations.   Neck pain    Neuromuscular disorder (HCC)    fibromyalgia   Nonintractable headache 08/05/2018   Osteoporosis    Palpitations 12/27/2018   Paresthesia 10/06/2020   Postmenopausal state 12/29/2021   Screen for colon cancer 07/06/2018   Last Assessment & Plan:  Formatting of this note might be different from the original. FOBT cards given   Serum sickness 05/06/2020   Last Assessment & Plan:  Formatting of this note might be different from the original. She is doing very well and is followed closely with [facility]  Last Assessment & Plan:  Formatting of this note might be different from the original. Doing well and follows with [facility].   Sore throat 02/24/2021   Suspected 2019 novel coronavirus infection 02/24/2021   Tinnitus 01/12/2022   Vaccine counseling 01/26/2022   Last Assessment & Plan:  Formatting of this note might be different from the original. Advised to get the pneumonia vaccine which is recommended by the CDC's ACIP. Advised to get the Shingrix vaccine (shingles) which is also  recommended by the CDC's ACIP, which may or may not be covered by her Medicare part D plan and which we do not stock at this office given the uncertainty regarding reimburseme   Vertigo 01/12/2022   Vitamin D deficiency 01/12/2022   Vocal cord dysfunction 09/16/2013     Past Surgical History:  Procedure Laterality Date   ABDOMINAL HYSTERECTOMY     EYE SURGERY Right    HERNIA REPAIR     KNEE SURGERY Right    WRIST SURGERY Right    left wrist in 2023   Family History  Problem Relation Age of Onset   Dementia Mother        heavy smoker   Heart failure Mother    Colon cancer Mother 48   Basal cell carcinoma Mother 17   Congestive Heart Failure Father    Diabetes Sister    Hyperlipidemia Sister    Diabetes Sister  Hyperlipidemia Sister    Cervical cancer Sister    Hyperlipidemia Brother    Heart attack Brother    Stroke Brother    Seizures Brother    Basal cell carcinoma Brother    Stroke Brother    Hyperlipidemia Brother    Colon cancer Maternal Aunt 44 - 64   Colon cancer Paternal Uncle 67 - 59   Heart defect Daughter    Colon cancer Daughter 32       stage 3   Colon polyps Son    Breast cancer Cousin        6 maternal second cousins   Breast cancer Other        maternal first cousin once removed- female breast cancer   Stomach cancer Neg Hx    Esophageal cancer Neg Hx    Social History   Tobacco Use   Smoking status: Never   Smokeless tobacco: Never  Vaping Use   Vaping status: Never Used  Substance Use Topics   Alcohol use: Never   Drug use: Never   Current Outpatient Medications  Medication Sig Dispense Refill   acyclovir (ZOVIRAX) 400 MG tablet Take 1 tablet by mouth as needed.     alum hydroxide-mag trisilicate (GAVISCON) 80-20 MG CHEW chewable tablet Chew by mouth.     aspirin 81 MG tablet Take 81 mg by mouth daily.     docusate sodium (COLACE) 100 MG capsule Take 100 mg by mouth daily as needed for moderate constipation.     EPINEPHrine 0.3 mg/0.3  mL IJ SOAJ injection   1   ezetimibe (ZETIA) 10 MG tablet Take 1 tablet (10 mg total) by mouth daily. 90 tablet 2   inclisiran (LEQVIO) 284 MG/1.5ML SOSY injection Inject 284 mg into the skin once.     omeprazole (PRILOSEC OTC) 20 MG tablet Take 20 mg by mouth 2 (two) times daily.     senna (SENOKOT) 8.6 MG tablet Take 1 tablet by mouth daily. As needed     No current facility-administered medications for this visit.   Allergies  Allergen Reactions   Bee Venom Other (See Comments)    Serum sickness reported with insect bite. Has been treated with prednisone only in the past per patient.  Also, fire ants.  Anaphylaxis reaction.   Benzonatate Other (See Comments)    Cannot take due due to all "caine" allergies   Cellulose Shortness Of Breath   Covid-19 (Mrna) Vaccine Anaphylaxis   Lidocaine Anaphylaxis    ALL CAINES PER PATIENT.   Peppermint Oil Anaphylaxis   Shellfish Allergy Anaphylaxis and Other (See Comments)    Pt Doesn't remember   Famotidine    Omeprazole    Atorvastatin Other (See Comments)    Dementia type symptoms, pain    Repatha [Evolocumab] Hives and Itching   Rosuvastatin Other (See Comments)    Dementia type symptoms, pain     Physical Exam: BP 136/80   Pulse 71   Ht 5' 7.25" (1.708 m)   Wt 157 lb (71.2 kg)   BMI 24.41 kg/m  Constitutional: Pleasant,well-developed, female in no acute distress. HEENT: Normocephalic and atraumatic. Conjunctivae are normal. No scleral icterus. Cardiovascular: Normal rate, regular rhythm.  Pulmonary/chest: Effort normal and breath sounds normal. No wheezing, rales or rhonchi. Abdominal: Soft, nondistended, nontender. Bowel sounds active throughout. There are no masses palpable. No hepatomegaly. Extremities: No edema Neurological: Alert and oriented to person place and time. Skin: Skin is warm and dry. No rashes noted. Psychiatric: Normal  mood and affect. Behavior is normal.  Labs 04/2019: CBC nml  Labs 01/2022: BMP nml. AST  and ALT nml.  Labs 01/2023: CBC normal.  CMP normal.  TSH normal.  Ab U/S 03/26/02: IMPRESSION  NEGATIVE ABDOMINAL ULTRASOUND.  NO GALLSTONES   EGD 01/21/07: Normal examination  Colonoscopy 01/21/07: Good prep. Normal examination.  EGD 01/24/23:  Path: 1. Surgical [P], gastric antrum and gastric body - GASTRIC ANTRAL AND OXYNTIC MUCOSA WITH FEATURES OF REACTIVE GASTROPATHY - NEGATIVE FOR H. PYLORI ON H&E STAIN - NEGATIVE FOR INTESTINAL METAPLASIA OR MALIGNANCY 2. Surgical [P], gastric polyps x3, polyp (3) - FUNDIC GLAND POLYPS 3. Surgical [P], GE junction - GASTROESOPHAGEAL JUNCTIONAL MUCOSA WITH NO SPECIFIC PATHOLOGIC CHANGES - NEGATIVE FOR INTESTINAL METAPLASIA, DYSPLASIA OR MALIGNANCY 4. Surgical [P], esophagus bx - SQUAMOUS MUCOSA WITH NO SPECIFIC PATHOLOGIC CHANGES - NEGATIVE FOR INCREASED INTRAEPITHELIAL EOSINOPHILS - NEGATIVE FOR DYSPLASIA OR MALIGNANCY  Colonoscopy 01/24/23:  Path: 5. Surgical [P], colon, cecum, ascending, polyp (4) - TUBULAR ADENOMA(S). - NO HIGH GRADE DYSPLASIA OR MALIGNANCY. - SESSILE SERRATED POLYP. - NO DYSPLASIA OR MALIGNANCY. - POLYPOID FRAGMENT OF BENIGN COLONIC MUCOSA WITH LYMPHOID AGGREGATE  ASSESSMENT AND PLAN: GERD Hiatal hernia Hemorrhoids History of colon polyps Family history of colon cancer Patient is overall doing well.  She was able to wean herself down from omeprazole 40 mg twice a day to 20 mg twice a day and has been able to keep her reflux under good control.  She has lost a few pounds due to increased physical activity.  She does occasionally still see rectal bleeding from her hemorrhoids, but though this is not bothersome and she knows how to deal with that at this time.  Her recent genetic testing for any cancer syndromes was negative, which was a relief to her.  Her daughter is now in remission from colon cancer, which she is very happy about. - Cont omeprazole 20 mg BID - Continue to avoid sitting for long periods of time to  avoid flaring up hemorrhoids - Recall for repeat colonoscopy in 01/2026 for history of colon polyps - RTC in 6 months  Eulah Pont, MD  I spent 34 minutes of time, including in depth chart review, independent review of results as outlined above, communicating results with the patient directly, face-to-face time with the patient, coordinating care, ordering studies and medications as appropriate, and documentation.

## 2023-07-24 NOTE — Patient Instructions (Signed)
Follow up in 6 months  If your blood pressure at your visit was 140/90 or greater, please contact your primary care physician to follow up on this.  _______________________________________________________  If you are age 67 or older, your body mass index should be between 23-30. Your Body mass index is 24.41 kg/m. If this is out of the aforementioned range listed, please consider follow up with your Primary Care Provider.  If you are age 7 or younger, your body mass index should be between 19-25. Your Body mass index is 24.41 kg/m. If this is out of the aformentioned range listed, please consider follow up with your Primary Care Provider.   ________________________________________________________  The [facility] providers would like to encourage you to use [facility] to communicate with providers for non-urgent requests or questions.  Due to long hold times on the telephone, sending your provider a message by [facility] may be a faster and more efficient way to get a response.  Please allow 48 business hours for a response.  Please remember that this is for non-urgent requests.  _______________________________________________________   Thank you for entrusting me with your care and for choosing [facility], Dr. Eulah Pont

## 2023-08-08 ENCOUNTER — Ambulatory Visit: Payer: Medicare PPO | Admitting: Emergency Medicine

## 2023-08-08 VITALS — BP 140/82 | HR 64 | Temp 98.2°F | Ht 67.25 in | Wt 153.5 lb

## 2023-08-08 DIAGNOSIS — K219 Gastro-esophageal reflux disease without esophagitis: Secondary | ICD-10-CM

## 2023-08-08 DIAGNOSIS — E785 Hyperlipidemia, unspecified: Secondary | ICD-10-CM | POA: Diagnosis not present

## 2023-08-08 DIAGNOSIS — B3731 Acute candidiasis of vulva and vagina: Secondary | ICD-10-CM | POA: Diagnosis not present

## 2023-08-08 DIAGNOSIS — I10 Essential (primary) hypertension: Secondary | ICD-10-CM | POA: Diagnosis not present

## 2023-08-08 MED ORDER — FLUCONAZOLE 150 MG PO TABS
150.0000 mg | ORAL_TABLET | Freq: Once | ORAL | 0 refills | Status: AC
Start: 2023-08-08 — End: 2023-08-08

## 2023-08-08 NOTE — Assessment & Plan Note (Signed)
Slightly elevated in the office but normal blood pressure readings at home Off medications.  Do not think she needs medications at this time. However advised to monitor blood pressure readings at home daily for the next several weeks and keep a log.  Advised to contact the office if numbers persistently abnormal Cardiovascular risks associated with hypertension discussed Patient exercises regularly and eats well.

## 2023-08-08 NOTE — Assessment & Plan Note (Signed)
Stable chronic condition Continues Leqvio 284 mg injections Continue Zetia 10 mg daily Diet and nutrition discussed

## 2023-08-08 NOTE — Patient Instructions (Signed)
Health Maintenance After Age 67 After age 67, you are at a higher risk for certain long-term diseases and infections as well as injuries from falls. Falls are a major cause of broken bones and head injuries in people who are older than age 67. Getting regular preventive care can help to keep you healthy and well. Preventive care includes getting regular testing and making lifestyle changes as recommended by your health care provider. Talk with your health care provider about: Which screenings and tests you should have. A screening is a test that checks for a disease when you have no symptoms. A diet and exercise plan that is right for you. What should I know about screenings and tests to prevent falls? Screening and testing are the best ways to find a health problem early. Early diagnosis and treatment give you the best chance of managing medical conditions that are common after age 67. Certain conditions and lifestyle choices may make you more likely to have a fall. Your health care provider may recommend: Regular vision checks. Poor vision and conditions such as cataracts can make you more likely to have a fall. If you wear glasses, make sure to get your prescription updated if your vision changes. Medicine review. Work with your health care provider to regularly review all of the medicines you are taking, including over-the-counter medicines. Ask your health care provider about any side effects that may make you more likely to have a fall. Tell your health care provider if any medicines that you take make you feel dizzy or sleepy. Strength and balance checks. Your health care provider may recommend certain tests to check your strength and balance while standing, walking, or changing positions. Foot health exam. Foot pain and numbness, as well as not wearing proper footwear, can make you more likely to have a fall. Screenings, including: Osteoporosis screening. Osteoporosis is a condition that causes  the bones to get weaker and break more easily. Blood pressure screening. Blood pressure changes and medicines to control blood pressure can make you feel dizzy. Depression screening. You may be more likely to have a fall if you have a fear of falling, feel depressed, or feel unable to do activities that you used to do. Alcohol use screening. Using too much alcohol can affect your balance and may make you more likely to have a fall. Follow these instructions at home: Lifestyle Do not drink alcohol if: Your health care provider tells you not to drink. If you drink alcohol: Limit how much you have to: 0-1 drink a day for women. 0-2 drinks a day for men. Know how much alcohol is in your drink. In the [place] one drink equals one 12 oz bottle of beer (355 mL), one 5 oz glass of wine (148 mL), or one 1 oz glass of hard liquor (44 mL). Do not use any products that contain nicotine or tobacco. These products include cigarettes, chewing tobacco, and vaping devices, such as e-cigarettes. If you need help quitting, ask your health care provider. Activity  Follow a regular exercise program to stay fit. This will help you maintain your balance. Ask your health care provider what types of exercise are appropriate for you. If you need a cane or walker, use it as recommended by your health care provider. Wear supportive shoes that have nonskid soles. Safety  Remove any tripping hazards, such as rugs, cords, and clutter. Install safety equipment such as grab bars in bathrooms and safety rails on stairs. Keep rooms and walkways   well-lit. General instructions Talk with your health care provider about your risks for falling. Tell your health care provider if: You fall. Be sure to tell your health care provider about all falls, even ones that seem minor. You feel dizzy, tiredness (fatigue), or off-balance. Take over-the-counter and prescription medicines only as told by your health care provider. These include  supplements. Eat a healthy diet and maintain a healthy weight. A healthy diet includes low-fat dairy products, low-fat (lean) meats, and fiber from whole grains, beans, and lots of fruits and vegetables. Stay current with your vaccines. Schedule regular health, dental, and eye exams. Summary Having a healthy lifestyle and getting preventive care can help to protect your health and wellness after age 67. Screening and testing are the best way to find a health problem early and help you avoid having a fall. Early diagnosis and treatment give you the best chance for managing medical conditions that are more common for people who are older than age 67. Falls are a major cause of broken bones and head injuries in people who are older than age 67. Take precautions to prevent a fall at home. Work with your health care provider to learn what changes you can make to improve your health and wellness and to prevent falls. This information is not intended to replace advice given to you by your health care provider. Make sure you discuss any questions you have with your health care provider. Document Revised: 03/21/2021 Document Reviewed: 03/21/2021 Elsevier Patient Education  2024 [employer]  

## 2023-08-08 NOTE — Progress Notes (Signed)
Rebecca Montgomery 67 y.o.   Chief Complaint  Patient presents with   Medical Management of Chronic Issues    f/u appt, patient states she is having some BP pressure issues, patient is having some feet issues, wants to make sure it is not neuropathy      Vaginitis    Patient states she may have a yeast infection     HISTORY OF PRESENT ILLNESS: This is a 67 y.o. female A1A here for 49-month follow-up of chronic medical problems Occasional feet discomfort issues and intermittent swelling Recently spent time in [place].  States blood pressure was high over there and had some leg edema as well Blood pressure at home has been normal recently Also complaining of vaginal itching with slight white discharge No other complaints or medical concerns today Overall doing well.  HPI   Prior to Admission medications   Medication Sig Start Date End Date Taking? Authorizing Provider  acyclovir (ZOVIRAX) 400 MG tablet Take 1 tablet by mouth as needed. 08/23/20  Yes [provider]  alum hydroxide-mag trisilicate (GAVISCON) 80-20 MG CHEW chewable tablet Chew by mouth.   Yes [provider]  aspirin 81 MG tablet Take 81 mg by mouth daily.   Yes [provider]  docusate sodium (COLACE) 100 MG capsule Take 100 mg by mouth daily as needed for moderate constipation.   Yes [provider]  EPINEPHrine 0.3 mg/0.3 mL IJ SOAJ injection  07/25/18  Yes [provider]  ezetimibe (ZETIA) 10 MG tablet Take 1 tablet by mouth once daily 07/25/23  Yes [name], [name], MD  inclisiran (LEQVIO) 284 MG/1.5ML SOSY injection Inject 284 mg into the skin once.   Yes [provider]  omeprazole (PRILOSEC OTC) 20 MG tablet Take 20 mg by mouth 2 (two) times daily.   Yes [provider]  senna (SENOKOT) 8.6 MG tablet Take 1 tablet by mouth daily. As needed   Yes [provider]    Allergies  Allergen Reactions   Bee Venom Other (See Comments)     Serum sickness reported with insect bite. Has been treated with prednisone only in the past per patient.  Also, fire ants.  Anaphylaxis reaction.   Benzonatate Other (See Comments)    Cannot take due due to all "caine" allergies   Cellulose Shortness Of Breath   Covid-19 (Mrna) Vaccine Anaphylaxis   Lidocaine Anaphylaxis    ALL CAINES PER PATIENT.   Peppermint Oil Anaphylaxis   Shellfish Allergy Anaphylaxis and Other (See Comments)    Pt Doesn't remember   Famotidine    Omeprazole    Atorvastatin Other (See Comments)    Dementia type symptoms, pain    Repatha [Evolocumab] Hives and Itching   Rosuvastatin Other (See Comments)    Dementia type symptoms, pain     Patient Active Problem List   Diagnosis Date Noted   Genetic testing 06/21/2023   Family history of colon cancer 06/12/2023   Family history of breast cancer 06/12/2023   Situational mixed anxiety and depressive disorder 02/05/2023   Essential hypertension 03/09/2022   Body mass index (BMI) of 23.0 to 23.9 in adult 01/26/2022   Vitamin D deficiency 01/12/2022   Secondary hypertension 01/12/2022   Postmenopausal state 12/29/2021   Osteoporosis    Basal cell adenocarcinoma    Acid reflux    Cervical spondylolysis 10/06/2020   Dyslipidemia 12/27/2018   History of skin cancer 10/03/2018   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 BMI Assessment: Current Body mass  index is 23.02 kg/m.  Patient BMI currently is in the acceptable range (>=18.5 and < 25 kg/m2) . Current barriers to healthy weight management include none.  BMI Plan:  Today, Tamula and I have discussed methods to help address her current weight.   No new intervention is indica   Cellulitis of left lower extremity 01/26/2020   Last Assessment & Plan:  Formatting of this note might be different from the original. Mild swelling and pain on examination. No drainage. 2 other small areas just on each side of the original area.  Will treat with antibiotic. She is to notify if not improving.   Cervical cancer screening 01/24/2021   Last Assessment & Plan:  Formatting of this note might be different from the original. She requested deference of gyn exam with pap today due to right hand fx. She will call to reschedule this at a later date.   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 Pertinent Data:   Current medication includes: diet modification and exercise This patient does not have an active medication from one of the medication groupers..  Last Lipid Values Cholesterol (mg/dl)  Date Value  09/81/1914 271 (H)  07/04/2018 306 (H)   Triglycerides (mg/dl)  Date Value  78/29/5621 214 (H)  08/22/2   Flu-like symptoms 02/24/2021   GERD (gastroesophageal reflux disease) 04/03/2018   Last Assessment & Plan:  Formatting of this note might be different from the original. Assessment Chronic and stable No compliance issue noted with today visit  Plan Continue current treatment regimen Reviewed risk of GERD Reviewed principals of treatment Patient medication Prilosec will be continued  Last Assessment & Plan:  Formatting of this note might be different from the original. Problem co   H/O total hysterectomy 08/09/2016   Headache     History of anaphylaxis 01/20/2015   History of skin cancer 10/03/2018   Last Assessment & Plan:  Formatting of this note might be different from the original. 2 small area of concern, on forehead and to the left of the nose on the bridge. No drainage. Red, irritated skin. 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 Formatting of this note might be different from the original. 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 Problem Complexity:  Chronic problem/illness, stable  Pertinent Data: LDL Calculated (mg/dL)  Date Value  30/86/5784 223 (H)   Cholesterol (mg/dl)  Date Value  01/09/2020 310 (H)   HDL (mg/dl)  Date Value  81/19/1478 56.5   Triglycerides (mg/dl)  Date Value  29/56/2130 151 (H)   AST (U/L)  Date Value  01/09/2020 16      Long term use of drug 05/01/2019   Last Assessment & Plan:  Formatting of this note might be different from the original. We reviewed age, sex, and risk factor related health maintenance interventions, largely based on the current USPSTF grade A and B recommendations.these included High blood pressure screening, diet and exercise counseling and immunization review and recommendations.   Neck pain    Neuromuscular disorder (HCC)    fibromyalgia   Nonintractable headache 08/05/2018   Osteoporosis    Palpitations 12/27/2018   Paresthesia 10/06/2020   Postmenopausal state 12/29/2021   Screen for colon cancer 07/06/2018   Last Assessment & Plan:  Formatting of this note might be different from the original. FOBT cards given   Serum sickness 05/06/2020   Last Assessment & Plan:  Formatting of this note might be different from the original. She is doing very  well and is followed closely with [facility]  Last Assessment & Plan:  Formatting of this note might be different from the original. Doing well and follows with [facility].   Sore throat 02/24/2021   Suspected 2019 novel coronavirus infection 02/24/2021   Tinnitus 01/12/2022   Vaccine counseling 01/26/2022   Last Assessment & Plan:  Formatting of this note might be different from the original. Advised to get the pneumonia vaccine which is recommended by the CDC's ACIP. Advised to get the Shingrix vaccine (shingles) which is also recommended by the CDC's ACIP, which may or may not be covered by her Medicare part D plan and which we do not stock at this office given the uncertainty regarding reimburseme   Vertigo 01/12/2022   Vitamin D deficiency 01/12/2022   Vocal cord dysfunction 09/16/2013    Past Surgical History:  Procedure Laterality Date   ABDOMINAL HYSTERECTOMY     EYE SURGERY Right    HERNIA REPAIR     KNEE SURGERY Right    WRIST SURGERY Right    left wrist in 2023    Social History   Socioeconomic History   Marital status: Divorced    Spouse name: Not on file   Number of children: 2   Years of education: masters   Highest education level: Master's degree (e.g., MA, MS, MEng, MEd, MSW, MBA)  Occupational History   Occupation: Retired  Tobacco Use   Smoking status: Never   Smokeless tobacco: Never  Vaping Use   Vaping status: Never Used  Substance and Sexual Activity   Alcohol use: Never   Drug use: Never   Sexual activity: Not on file  Other Topics Concern   Not on file  Social History Narrative   Lives at home alone.   Right-handed.   Glass of tea in the morning.    Social Determinants of Health   Financial Resource Strain: Low Risk  (08/08/2023)   Overall Financial Resource Strain (CARDIA)    Difficulty of Paying Living Expenses: Not hard at all  Food Insecurity: No Food Insecurity (08/08/2023)   Hunger Vital Sign    Worried About Running Out  of Food in the Last Year: Never true    Ran Out of Food in the Last Year: Never true  Transportation Needs: No Transportation Needs (08/08/2023)   PRAPARE - Transportation    Lack of  Transportation (Medical): No    Lack of Transportation (Non-Medical): No  Physical Activity: Sufficiently Active (08/08/2023)   Exercise Vital Sign    Days of Exercise per Week: 7 days    Minutes of Exercise per Session: 60 min  Stress: No Stress Concern Present (08/08/2023)   [employer] of Occupational Health - Occupational Stress Questionnaire    Feeling of Stress : Not at all  Social Connections: Moderately Integrated (08/08/2023)   Social Connection and Isolation Panel [NHANES]    Frequency of Communication with Friends and Family: More than three times a week    Frequency of Social Gatherings with Friends and Family: More than three times a week    Attends Religious Services: More than 4 times per year    Active Member of [employer] or Organizations: Yes    Attends Engineer, structural: More than 4 times per year    Marital Status: Divorced  Intimate Partner Violence: Not At Risk (06/01/2023)   Humiliation, Afraid, Rape, and Kick questionnaire    Fear of Current or Ex-Partner: No    Emotionally Abused: No    Physically Abused: No    Sexually Abused: No    Family History  Problem Relation Age of Onset   Dementia Mother        heavy smoker   Heart failure Mother    Colon cancer Mother 58   Basal cell carcinoma Mother 69   Congestive Heart Failure Father    Diabetes Sister    Hyperlipidemia Sister    Diabetes Sister    Hyperlipidemia Sister    Cervical cancer Sister    Hyperlipidemia Brother    Heart attack Brother    Stroke Brother    Seizures Brother    Basal cell carcinoma Brother    Stroke Brother    Hyperlipidemia Brother    Colon cancer Maternal Aunt 60 - 64   Colon cancer Paternal Uncle 23 - 59   Heart defect Daughter    Colon cancer Daughter 45       stage 3   Colon  polyps Son    Breast cancer Cousin        6 maternal second cousins   Breast cancer Other        maternal first cousin once removed- female breast cancer   Stomach cancer Neg Hx    Esophageal cancer Neg Hx      Review of Systems  Constitutional: Negative.  Negative for chills and fever.  HENT: Negative.  Negative for congestion and sore throat.   Respiratory: Negative.  Negative for cough and shortness of breath.   Cardiovascular: Negative.  Negative for chest pain and palpitations.  Gastrointestinal:  Negative for abdominal pain, diarrhea, nausea and vomiting.  Genitourinary: Negative.  Negative for dysuria and hematuria.  Skin: Negative.  Negative for rash.  Neurological: Negative.  Negative for dizziness and headaches.  All other systems reviewed and are negative.   Vitals:   08/08/23 1549  BP: (!) 140/82  Pulse: 64  Temp: 98.2 F (36.8 C)  SpO2: 96%    Physical Exam Vitals reviewed.  Constitutional:      Appearance: Normal appearance.  HENT:     Head: Normocephalic.     Mouth/Throat:     Mouth: Mucous membranes are moist.     Pharynx: Oropharynx is clear.  Eyes:     Extraocular Movements: Extraocular movements intact.     Conjunctiva/sclera: Conjunctivae normal.     Pupils: Pupils are equal,  round, and reactive to light.  Cardiovascular:     Rate and Rhythm: Normal rate and regular rhythm.     Pulses: Normal pulses.     Heart sounds: Normal heart sounds.  Pulmonary:     Effort: Pulmonary effort is normal.     Breath sounds: Normal breath sounds.  Abdominal:     Palpations: Abdomen is soft.     Tenderness: There is no abdominal tenderness.  Musculoskeletal:     Cervical back: No tenderness.     Comments: Lower extremities: Warm to touch.  No erythema or ecchymosis.  No swelling.  No tenderness.  Excellent peripheral pulses and excellent capillary refill.  Intact sensation.  No signs of DVT.  No abnormal findings  Lymphadenopathy:     Cervical: No cervical  adenopathy.  Skin:    General: Skin is warm and dry.     Capillary Refill: Capillary refill takes less than 2 seconds.  Neurological:     General: No focal deficit present.     Mental Status: She is alert and oriented to person, place, and time.  Psychiatric:        Mood and Affect: Mood normal.        Behavior: Behavior normal.      ASSESSMENT & PLAN: A total of 42 minutes was spent with the patient and counseling/coordination of care regarding preparing for this visit, review of most recent office visit notes, review of chronic medical conditions under management, review of all medications, review of most recent blood work results, cardiovascular risks associated with dyslipidemia, education on nutrition, review of health maintenance items, prognosis, documentation, and need for follow-up.  Problem List Items Addressed This Visit       Cardiovascular and Mediastinum   Essential hypertension - Primary    Slightly elevated in the office but normal blood pressure readings at home Off medications.  Do not think she needs medications at this time. However advised to monitor blood pressure readings at home daily for the next several weeks and keep a log.  Advised to contact the office if numbers persistently abnormal Cardiovascular risks associated with hypertension discussed Patient exercises regularly and eats well.        Digestive   GERD (gastroesophageal reflux disease)    Stable and asymptomatic.  No concerns        Other   Dyslipidemia    Stable chronic condition Continues Leqvio 284 mg injections Continue Zetia 10 mg daily Diet and nutrition discussed      Other Visit Diagnoses     Yeast vaginitis       Relevant Medications   fluconazole (DIFLUCAN) 150 MG tablet      Patient Instructions  Health Maintenance After Age 35 After age 38, you are at a higher risk for certain long-term diseases and infections as well as injuries from falls. Falls are a major cause  of broken bones and head injuries in people who are older than age 39. Getting regular preventive care can help to keep you healthy and well. Preventive care includes getting regular testing and making lifestyle changes as recommended by your health care provider. Talk with your health care provider about: Which screenings and tests you should have. A screening is a test that checks for a disease when you have no symptoms. A diet and exercise plan that is right for you. What should I know about screenings and tests to prevent falls? Screening and testing are the best ways to find a health  problem early. Early diagnosis and treatment give you the best chance of managing medical conditions that are common after age 64. Certain conditions and lifestyle choices may make you more likely to have a fall. Your health care provider may recommend: Regular vision checks. Poor vision and conditions such as cataracts can make you more likely to have a fall. If you wear glasses, make sure to get your prescription updated if your vision changes. Medicine review. Work with your health care provider to regularly review all of the medicines you are taking, including over-the-counter medicines. Ask your health care provider about any side effects that may make you more likely to have a fall. Tell your health care provider if any medicines that you take make you feel dizzy or sleepy. Strength and balance checks. Your health care provider may recommend certain tests to check your strength and balance while standing, walking, or changing positions. Foot health exam. Foot pain and numbness, as well as not wearing proper footwear, can make you more likely to have a fall. Screenings, including: Osteoporosis screening. Osteoporosis is a condition that causes the bones to get weaker and break more easily. Blood pressure screening. Blood pressure changes and medicines to control blood pressure can make you feel dizzy. Depression  screening. You may be more likely to have a fall if you have a fear of falling, feel depressed, or feel unable to do activities that you used to do. Alcohol use screening. Using too much alcohol can affect your balance and may make you more likely to have a fall. Follow these instructions at home: Lifestyle Do not drink alcohol if: Your health care provider tells you not to drink. If you drink alcohol: Limit how much you have to: 0-1 drink a day for women. 0-2 drinks a day for men. Know how much alcohol is in your drink. In the [place] one drink equals one 12 oz bottle of beer (355 mL), one 5 oz glass of wine (148 mL), or one 1 oz glass of hard liquor (44 mL). Do not use any products that contain nicotine or tobacco. These products include cigarettes, chewing tobacco, and vaping devices, such as e-cigarettes. If you need help quitting, ask your health care provider. Activity  Follow a regular exercise program to stay fit. This will help you maintain your balance. Ask your health care provider what types of exercise are appropriate for you. If you need a cane or walker, use it as recommended by your health care provider. Wear supportive shoes that have nonskid soles. Safety  Remove any tripping hazards, such as rugs, cords, and clutter. Install safety equipment such as grab bars in bathrooms and safety rails on stairs. Keep rooms and walkways well-lit. General instructions Talk with your health care provider about your risks for falling. Tell your health care provider if: You fall. Be sure to tell your health care provider about all falls, even ones that seem minor. You feel dizzy, tiredness (fatigue), or off-balance. Take over-the-counter and prescription medicines only as told by your health care provider. These include supplements. Eat a healthy diet and maintain a healthy weight. A healthy diet includes low-fat dairy products, low-fat (lean) meats, and fiber from whole grains, beans, and  lots of fruits and vegetables. Stay current with your vaccines. Schedule regular health, dental, and eye exams. Summary Having a healthy lifestyle and getting preventive care can help to protect your health and wellness after age 79. Screening and testing are the best way to find a  health problem early and help you avoid having a fall. Early diagnosis and treatment give you the best chance for managing medical conditions that are more common for people who are older than age 5. Falls are a major cause of broken bones and head injuries in people who are older than age 69. Take precautions to prevent a fall at home. Work with your health care provider to learn what changes you can make to improve your health and wellness and to prevent falls. This information is not intended to replace advice given to you by your health care provider. Make sure you discuss any questions you have with your health care provider. Document Revised: 03/21/2021 Document Reviewed: 03/21/2021 Elsevier Patient Education  2024 Elsevier Inc.      Edwina Barth, MD [facility]

## 2023-08-08 NOTE — Assessment & Plan Note (Signed)
Stable and asymptomatic.  No concerns.

## 2023-08-10 ENCOUNTER — Ambulatory Visit (INDEPENDENT_AMBULATORY_CARE_PROVIDER_SITE_OTHER)
Admission: RE | Admit: 2023-08-10 | Discharge: 2023-08-10 | Disposition: A | Discharge status: Home / Self Care | Payer: Medicare PPO | Source: Ambulatory Visit | Attending: Emergency Medicine | Admitting: Emergency Medicine

## 2023-08-10 DIAGNOSIS — Z0000 Encounter for general adult medical examination without abnormal findings: Secondary | ICD-10-CM

## 2023-08-10 DIAGNOSIS — Z13820 Encounter for screening for osteoporosis: Secondary | ICD-10-CM

## 2023-08-10 DIAGNOSIS — Z780 Asymptomatic menopausal state: Secondary | ICD-10-CM | POA: Diagnosis not present

## 2023-08-28 ENCOUNTER — Ambulatory Visit: Payer: Medicare PPO | Attending: Cardiology | Admitting: Cardiology

## 2023-08-31 ENCOUNTER — Ambulatory Visit: Payer: Medicare PPO | Admitting: Cardiology

## 2023-09-05 NOTE — Progress Notes (Signed)
 " Cardiology Office Note:  .   Date:  09/06/2023  ID:  Rebecca Montgomery, DOB [birth year], MRN [id number] PCP: Purcell Emil Schanz, MD  [facility] Providers Cardiologist:  None    History of Present Illness: .   Rebecca Montgomery is a 67 y.o. female with a past medical history of hypertension, GERD, dyslipidemia.  01/30/2020 echo EF 60 to 65%, mild concentric LVH, no valvular abnormalities 01/31/2019 stress echo no stress-induced wall motion abnormalities, negative for ischemia 12/31/2018 monitor predominantly in sinus, PVCs noted  Most recently evaluated by Dr. Bernie on 03/09/2022, she was doing well from a cardiac perspective, continued to be very active, she was advised she could follow-up in 1 year.  She presents today for follow-up of her hypertension and dyslipidemia.  She has had a very stressful few months, her daughter was diagnosed with stage III cancer, she had been in [place]  caring for her and her grandchildren, also a lot of unexpected personal stress.  She has noticed that her blood pressure had been increasing, she has been intentional trying to lose weight.  Her blood pressure is elevated in the office today initially however recheck was better at 134/76.  She has also been in considerable pain secondary to neck discomfort. She denies chest pain, palpitations, dyspnea, pnd, orthopnea, n, v, dizziness, syncope, edema, weight gain, or early satiety.    ROS: Review of Systems  Constitutional: Negative.   HENT: Negative.    Eyes: Negative.   Cardiovascular: Negative.   Gastrointestinal: Negative.   Genitourinary: Negative.   Musculoskeletal:  Positive for neck pain.  Skin: Negative.   Neurological: Negative.   Endo/Heme/Allergies: Negative.   Psychiatric/Behavioral: Negative.      Studies Reviewed: SABRA   EKG Interpretation Date/Time:  Thursday September 06 2023 14:14:37 EDT Ventricular Rate:  67 PR Interval:  144 QRS Duration:  90 QT Interval:  404 QTC  Calculation: 426 R Axis:   14  Text Interpretation: Normal sinus rhythm Low voltage QRS Borderline ECG When compared with ECG of 16-Oct-2002 11:42, Questionable change in QRS axis Confirmed by Carlin Nest [phone]) on 09/06/2023 2:34:19 PM    Cardiac Studies & Procedures     STRESS TESTS  ECHOCARDIOGRAM STRESS TEST 01/31/2019  Narrative EXERCISE STRESS REPORT    Patient Name:   Rebecca Montgomery Date of Exam: 01/31/2019 Medical Rec #:  [id number]      Height:       68.0 in Accession #:    [id number]     Weight:       153.4 lb Date of Birth:  09/08/1956      BSA:          1.83 m Patient Age:    62 years       BP:           130/82 mmHg Patient Gender: F              HR:           93 bpm. Exam Location:     Procedure: Stress Echo  Indications:    Palpitations [R00.2 (ICD-10-CM)]; Atypical chest pain [R07.89 (ICD-10-CM)]  History:        Patient has prior history of Echocardiogram examinations, most recent 01/30/2019.  Sonographer:    Lynwood Silvas Referring Phys: [phone] ROBERT J KRASOWSKI  Transthoracic Stress Echo for Chest Pain evaluation.  Stress Protocol: The patient exercised on a treadmill for 7 minutes minutes and 59 seconds to stage III of  a Bruce protocol, achieving 10 METS. The patient exercised on a treadmill according to a Bruce protocol.  Protocol Termination: The Stress test was discontinued at stage 3 due to target heart rate achieved.  Resting HR:     93           bpm Resting BP:  130/82 Target HR:      158          bpm Peak BP:     176/86 Peak HR:        176          bpm BP Response: normal % of Target HR: 111          bpm HR Achieved:    was achieved  Patient Tolerance: The patient developed no symptoms during the stress exam.  EKG: Resting EKG showed normal sinus rhythm at a rate of 93 beats per minute with no abnormal findings. The patient developed no abnormal findings during exercise.  LV Pre-Stress Systolic Function: Pre-stress testing the left  ventricular systolic function was normal systolic function with a 55-60% ejection fraction.  LV Post-Stress Systolic Function: Post-stress testing, the left ventricular systolic function was hyperdynamic systolic function with a >65% ejection fraction.   Symptoms Stage I    no symptoms Stage II   no symptoms Stage III  no symptoms Stage IV   no symptoms Stage V    no symptoms Stage VI   no symptoms Stage VII  no symptoms Stage VIII no symptoms  RECOVERY Symptoms no symptoms no symptoms no symptoms no symptoms no symptoms no symptoms   There were no stress-induced wall motion abnormalities. This is a negative stress echo test for ischemia.  FINDINGS Left Ventricle: The left ventricle has normal systolic function, with an ejection fraction of 60-65%. The cavity size was normal. There is no increase in left ventricular wall thickness. Right Ventricle: The right ventricle has normal systolic function. The cavity was normal. There is no increase in right ventricular wall thickness. Left Atrium: Left atrial size was normal in size. Right Atrium: Right atrial size was normal in size. Right atrial pressure is estimated at 10 mmHg. Interatrial Septum: No atrial level shunt detected by color flow Doppler. Pericardium: There is no evidence of pericardial effusion. Mitral Valve: Mitral valve regurgitation was not assessed by color flow Doppler. Tricuspid Valve: Tricuspid valve regurgitation was not visualized by color flow Doppler. Aortic Valve: Aortic valve regurgitation was not assessed by color flow Doppler. Pulmonic Valve: Pulmonic valve regurgitation was not assessed by color flow Doppler. Venous: The inferior vena cava is normal in size with greater than 50% respiratory variability. Additional Findings: Stress echo negative for evidence of inducible ischemia. Good exercise capacity and hypertensive response.  RIGHT ATRIUM RA Pressure: 10 mmHg   Rebecca Onofrio Crape  MD Electronically signed by Dalayna Lauter Crape MD Signature Date/Time: 01/31/2019/6:38:47 PM    Final   ECHOCARDIOGRAM  ECHOCARDIOGRAM COMPLETE 01/30/2020  Narrative ECHOCARDIOGRAM REPORT    Patient Name:   Rebecca Montgomery Date of Exam: 01/30/2020 Medical Rec #:  [id number]      Height:       68.0 in Accession #:    [id number]     Weight:       145.6 lb Date of Birth:  02-10-1956      BSA:          1.786 m Patient Age:    63 years       BP:  120/62 mmHg Patient Gender: F              HR:           73 bpm. Exam Location:  [place]  Procedure: 2D Echo  Indications:    Palpitations 785.1 / R00.2  History:        Patient has prior history of Echocardiogram examinations, most recent 01/30/2019. Signs/Symptoms:Chest Pain; Risk Factors:Dyslipidemia.  Sonographer:    Lynwood Silvas Referring Phys: [phone] ROBERT J KRASOWSKI  IMPRESSIONS   1. Left ventricular ejection fraction, by estimation, is 60 to 65%. The left ventricle has normal function. The left ventricle has no regional wall motion abnormalities. There is mild concentric left ventricular hypertrophy. Left ventricular diastolic parameters were normal. 2. Right ventricular systolic function is normal. The right ventricular size is normal. There is normal pulmonary artery systolic pressure. 3. The mitral valve is normal in structure. No evidence of mitral valve regurgitation. No evidence of mitral stenosis. 4. The aortic valve is normal in structure. Aortic valve regurgitation is not visualized. No aortic stenosis is present. 5. The inferior vena cava is normal in size with greater than 50% respiratory variability, suggesting right atrial pressure of 3 mmHg.  Comparison(s): No significant change from prior study.  FINDINGS Left Ventricle: Left ventricular ejection fraction, by estimation, is 60 to 65%. The left ventricle has normal function. The left ventricle has no regional wall motion abnormalities. The left ventricular  internal cavity size was normal in size. There is mild concentric left ventricular hypertrophy. Left ventricular diastolic parameters were normal.  Right Ventricle: The right ventricular size is normal. No increase in right ventricular wall thickness. Right ventricular systolic function is normal. There is normal pulmonary artery systolic pressure. The tricuspid regurgitant velocity is 1.64 m/s, and with an assumed right atrial pressure of 8 mmHg, the estimated right ventricular systolic pressure is 18.8 mmHg.  Left Atrium: Left atrial size was normal in size.  Right Atrium: Right atrial size was normal in size.  Pericardium: There is no evidence of pericardial effusion.  Mitral Valve: The mitral valve is normal in structure. Normal mobility of the mitral valve leaflets. No evidence of mitral valve regurgitation. No evidence of mitral valve stenosis.  Tricuspid Valve: The tricuspid valve is normal in structure. Tricuspid valve regurgitation is trivial. No evidence of tricuspid stenosis.  Aortic Valve: The aortic valve is normal in structure. Aortic valve regurgitation is not visualized. No aortic stenosis is present.  Pulmonic Valve: The pulmonic valve was not well visualized. Pulmonic valve regurgitation is not visualized. No evidence of pulmonic stenosis.  Aorta: The aortic root is normal in size and structure.  Venous: The inferior vena cava is normal in size with greater than 50% respiratory variability, suggesting right atrial pressure of 3 mmHg.  IAS/Shunts: No atrial level shunt detected by color flow Doppler.   LEFT VENTRICLE PLAX 2D LVIDd:         3.80 cm  Diastology LVIDs:         2.50 cm  LV e' lateral:   13.40 cm/s LV PW:         1.00 cm  LV E/e' lateral: 6.7 LV IVS:        1.00 cm  LV e' medial:    9.84 cm/s LVOT diam:     1.90 cm  LV E/e' medial:  9.1 LV SV:         87 LV SV Index:   49  2D Longitudinal Strain LVOT Area:     2.84 cm 2D Strain GLS Avg:     -12.3  %   RIGHT VENTRICLE             IVC RV S prime:     11.90 cm/s  IVC diam: 2.00 cm TAPSE (M-mode): 1.9 cm  LEFT ATRIUM             Index       RIGHT ATRIUM           Index LA diam:        3.10 cm 1.74 cm/m  RA Area:     12.80 cm LA Vol (A2C):   21.4 ml 11.98 ml/m RA Volume:   29.90 ml  16.74 ml/m LA Vol (A4C):   18.6 ml 10.42 ml/m LA Biplane Vol: 21.6 ml 12.10 ml/m AORTIC VALVE LVOT Vmax:   145.00 cm/s LVOT Vmean:  99.900 cm/s LVOT VTI:    0.306 m  AORTA Ao Root diam: 2.80 cm Ao Asc diam:  2.90 cm  MITRAL VALVE               TRICUSPID VALVE MV Area (PHT): 3.17 cm    TR Peak grad:   10.8 mmHg MV Decel Time: 239 msec    TR Vmax:        164.00 cm/s MV E velocity: 89.70 cm/s MV A velocity: 76.20 cm/s  SHUNTS MV E/A ratio:  1.18        Systemic VTI:  0.31 m Systemic Diam: 1.90 cm  Dub Tobb DO Electronically signed by Dub Huntsman DO Signature Date/Time: 01/30/2020/4:23:57 PM    Final    [facility]  CARDIAC EVENT MONITOR 12/31/2018  Narrative Rebecca Montgomery, DOB 12-27-1955, MRN [id number]  EVENT MONITOR REPORT:   Patient was monitored from December 31, 2018 to January 29, 2019. Indication:                    Palpitations Ordering physician:  Lamar JINNY Fitch, MD Referring physician:  Lamar JINNY Fitch, MD   Baseline rhythm: Sinus   Atrial arrhythmia: None  Ventricular arrhythmia: PVCs noted but no sustained arrhythmia  Conduction abnormality: None  Symptoms: Typical symptoms of palpitations are as well as some dizziness   Conclusion: Symptomatic PVCs felt as palpitations  Interpreting  cardiologist: Lamar Fitch, MD Date: 02/09/2019 12:59 PM           Risk Assessment/Calculations:             Physical Exam:   VS:  BP 134/72   Pulse 67   Ht 5' 7.25 (1.708 m)   Wt 148 lb (67.1 kg)   SpO2 96%   BMI 23.01 kg/m    Wt Readings from Last 3 Encounters:  09/06/23 148 lb (67.1 kg)  08/08/23 153 lb 8 oz (69.6 kg)  07/24/23 157 lb (71.2 kg)     GEN: Fit, appears younger than stated age, well nourished, well developed in no acute distress NECK: No JVD; No carotid bruits CARDIAC: RRR, no murmurs, rubs, gallops RESPIRATORY:  Clear to auscultation without rales, wheezing or rhonchi  ABDOMEN: Soft, non-tender, non-distended EXTREMITIES:  No edema; No deformity   ASSESSMENT AND PLAN: .   Hypertension-blood pressure was initially elevated at 145/81 however upon recheck it was 134/76.  She has been getting various blood pressure readings at home, 1 time as high as in the 170 systolic range.  She has also had a very high level  of personal stress on her, also dealing with ongoing neck pain.  For now, she will continue to monitor her blood pressure at home as she does not want to take any medications if she can make lifestyle changes.  Had previously been on metoprolol  and Cardizem --which she could not tolerate.  Dyslipidemia with statin intolerance-formerly followed by Dr. Mona, she is currently on Zetia  and Leqvio , she has appointment to see Dr. Mona in December with plans to repeat her lipid level.       Dispo: Follow-up appointment for 3 months so we can keep a check on her blood pressure.  Signed, Delon JAYSON Hoover, NP  "

## 2023-09-06 ENCOUNTER — Encounter: Payer: Self-pay | Admitting: Cardiology

## 2023-09-06 ENCOUNTER — Ambulatory Visit: Payer: Medicare PPO | Attending: Cardiology | Admitting: Cardiology

## 2023-09-06 VITALS — BP 134/72 | HR 67 | Ht 67.25 in | Wt 148.0 lb

## 2023-09-06 DIAGNOSIS — I10 Essential (primary) hypertension: Secondary | ICD-10-CM

## 2023-09-06 DIAGNOSIS — Z789 Other specified health status: Secondary | ICD-10-CM

## 2023-09-06 DIAGNOSIS — K219 Gastro-esophageal reflux disease without esophagitis: Secondary | ICD-10-CM

## 2023-09-06 DIAGNOSIS — E7849 Other hyperlipidemia: Secondary | ICD-10-CM

## 2023-09-06 NOTE — Patient Instructions (Signed)
Medication Instructions:  Your physician recommends that you continue on your current medications as directed. Please refer to the Current Medication list given to you today.  *If you need a refill on your cardiac medications before your next appointment, please call your pharmacy*   Lab Work: NONE If you have labs (blood work) drawn today and your tests are completely normal, you will receive your results only by: [place] (if you have MyChart) OR A paper copy in the mail If you have any lab test that is abnormal or we need to change your treatment, we will call you to review the results.   Testing/Procedures: NONE   Follow-Up: At [facility], you and your health needs are our priority.  As part of our continuing mission to provide you with exceptional heart care, we have created designated Provider Care Teams.  These Care Teams include your primary Cardiologist (physician) and Advanced Practice Providers (APPs -  Physician Assistants and Nurse Practitioners) who all work together to provide you with the care you need, when you need it.  We recommend signing up for the patient portal called "MyChart".  Sign up information is provided on this After Visit Summary.  MyChart is used to connect with patients for Virtual Visits (Telemedicine).  Patients are able to view lab/test results, encounter notes, upcoming appointments, etc.  Non-urgent messages can be sent to your provider as well.   To learn more about what you can do with MyChart, go to [email or web address].    Your next appointment:   3 month(s)  Provider:   Jenne Campus, MD    Other Instructions

## 2023-09-17 ENCOUNTER — Telehealth: Payer: Self-pay

## 2023-09-17 NOTE — Telephone Encounter (Signed)
Auth Submission: APPROVED Site of care: Site of care: [facility] Payer: [employer] Medication & CPT/J Code(s) submitted: Leqvio (Inclisiran) [id number] Route of submission (phone, fax, portal): auto renew Phone # Fax # Auth type: Buy/Bill PB Units/visits requested: 284mg  x 2 doses Reference number: [id number] Approval from: 11/14/23 to 11/12/24

## 2023-10-21 LAB — LIPID PANEL
Chol/HDL Ratio: 3.1 {ratio} (ref 0.0–4.4)
Cholesterol, Total: 179 mg/dL (ref 100–199)
HDL: 58 mg/dL (ref >39)
LDL Chol Calc (NIH): 88 mg/dL (ref 0–99)
Triglycerides: 196 mg/dL — ABNORMAL HIGH (ref 0–149)
VLDL Cholesterol Cal: 33 mg/dL (ref 5–40)

## 2023-10-21 LAB — LIPOPROTEIN A (LPA): Lipoprotein (a): 22.8 nmol/L (ref <75.0)

## 2023-10-23 ENCOUNTER — Ambulatory Visit (HOSPITAL_BASED_OUTPATIENT_CLINIC_OR_DEPARTMENT_OTHER): Payer: Medicare PPO | Admitting: Internal Medicine

## 2023-10-23 ENCOUNTER — Encounter (HOSPITAL_BASED_OUTPATIENT_CLINIC_OR_DEPARTMENT_OTHER): Payer: Self-pay | Admitting: Internal Medicine

## 2023-10-23 VITALS — BP 126/80 | HR 72 | Ht 68.0 in | Wt 143.9 lb

## 2023-10-23 DIAGNOSIS — E7849 Other hyperlipidemia: Secondary | ICD-10-CM | POA: Diagnosis not present

## 2023-10-23 DIAGNOSIS — Z789 Other specified health status: Secondary | ICD-10-CM

## 2023-10-23 NOTE — Patient Instructions (Signed)
Medication Instructions:  NO CHANGES  *If you need a refill on your cardiac medications before your next appointment, please call your pharmacy*   Lab Work: FASTING lipid panel in 6 months with Dr. Rennis Golden    Follow-Up: At [facility], you and your health needs are our priority.  As part of our continuing mission to provide you with exceptional heart care, we have created designated Provider Care Teams.  These Care Teams include your primary Cardiologist (physician) and Advanced Practice Providers (APPs -  Physician Assistants and Nurse Practitioners) who all work together to provide you with the care you need, when you need it.  We recommend signing up for the patient portal called "MyChart".  Sign up information is provided on this After Visit Summary.  MyChart is used to connect with patients for Virtual Visits (Telemedicine).  Patients are able to view lab/test results, encounter notes, upcoming appointments, etc.  Non-urgent messages can be sent to your provider as well.   To learn more about what you can do with MyChart, go to [email or web address].    Your next appointment:    6 months with Dr. Rennis Golden -- in person or video visit

## 2023-10-23 NOTE — Progress Notes (Signed)
LIPID CLINIC CONSULT NOTE  Chief Complaint:  Manage dyslipidemia  Primary Care Physician: Georgina Quint, MD  Primary Cardiologist:  None  HPI:  Rebecca Montgomery is a 67 y.o. female who is being seen today for the evaluation of dyslipidemia at the request of Georgina Quint, *.  This is a pleasant 67 year old female with longstanding history of dyslipidemia probably with the notable elevation in her cholesterol since she was told in her 2s.  She has a strong family history of heart disease and stroke including 2 brothers who had strokes in their 23s and 60s.  There is also family history of high triglycerides although her triglycerides now are better controlled but has been as high as 294 in the past.  Findings are highly concerning for familial hyperlipidemia or possibly familial combined hyperlipidemia.  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  She has had some worsening memory difficulty/memory fog on statins in the past and has been tried on several including atorvastatin, rosuvastatin and others.  She is averse to taking a statin medication.  Her most recent lipid showed total cholesterol 310, triglycerides 151, HDL 56 and LDL of 223.  She reports a very heart healthy diet in fact nearly vegan diet.  Her weight generally is around 120 pounds however recently she had a MRSA infection as well as an anaphylactic reaction to the modality vaccine which took several weeks to recover from.  She also has a history of anaphylaxis and serum sickness and allergies to shellfish and bee venom as well as fire ants.  09/23/2020  Rebecca Montgomery returns today for follow-up.  Her lipids are improved despite not being on therapy.  She is made dietary changes, more physical activity and other things and has come off of some steroid medications that she had been taking more regularly.  All of this may have contributed to improvement in her lipids with the most recent total  cholesterol 248, triglycerides 129, HDL 63 and LDL 162.  Overall this is significant improvement however I suspect cannot be much lower as she is maximized her lifestyle modifications.  Because of her family history of stroke I would try to target LDL lower ideally at 100 if we can achieve that.  We discussed several other options for possible therapies.  06/08/2021  Rebecca Montgomery returns today for follow-up.  She is noted she has had a lot of challenges recently with her overall health and family issues as well as some issues with her home that has not allowed her to eat normally.  Despite this and some weight gain with steroids, her cholesterol is lower.  Now total cholesterol 234, triglycerides 263, HDL 45 and LDL 141, down from 162.  She is taking ezetimibe which was falsely listed as causing anaphylaxis.  This is been removed from her allergies.  Although her numbers are better, still well above target.  She does have a likely FH with untreated LDL cholesterol 223.  Unfortunately she cannot tolerate the PCSK9 antibody inhibitors.  We discussed other options including Leqvio, which may be a good option for her.  08/30/2022  Rebecca Montgomery is seen today in follow-up.  She is done extremely well on Leqvio.  She says she is tolerating it without any side effects.  Her cholesterols come down substantially.  LDL is now 61 down from 141, total cholesterol 154, triglycerides 134 and HDL 70.  She also maintains on ezetimibe.  She did want to report  that her brother unfortunately died of complications with an MI in his 36s, but he ultimately had a seizure.  She has no cardiac symptoms.  10/23/2023  Rebecca Montgomery is seen today in follow-up.  She remains on Leqvio.  She is tolerating this very well.  Unfortunately she has had a small increase in her cholesterol.  Total cholesterol now 179, triglycerides 196, HDL 58 and LDL 88.  This is up from an LDL of 61.  She reports her diet is been fairly stable but she has been less  active.  This might explain this.  She remains on ezetimibe as well.  PMHx:  Past Medical History:  Diagnosis Date   Acid reflux    Acute conjunctivitis of left eye 02/24/2021   Arthritis    Atypical chest pain 12/27/2018   Back pain    Basal cell adenocarcinoma    Body mass index (BMI) of 23.0 to 23.9 in adult 01/26/2022   Last Assessment & Plan:  Formatting of this note might be different from the original. BMI Assessment: Current Body mass index is 23.02 kg/m.  Patient BMI currently is in the acceptable range (>=18.5 and < 25 kg/m2) . Current barriers to healthy weight management include none.  BMI Plan:  Today, Rebecca Montgomery and I have discussed methods to help address her current weight.   No new intervention is indica   Cellulitis of left lower extremity 01/26/2020   Last Assessment & Plan:  Formatting of this note might be different from the original. Mild swelling and pain on examination. No drainage. 2 other small areas just on each side of the original area.  Will treat with antibiotic. She is to notify if not improving.   Cervical cancer screening 01/24/2021   Last Assessment & Plan:  Formatting of this note might be different from the original. She requested deference of gyn exam with pap today due to right hand fx. She will call to reschedule this at a later date.   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 Pertinent Data:   Current medication includes: diet modification and exercise This patient does not have an active medication from one of the medication groupers..  Last Lipid Values Cholesterol (mg/dl)  Date Value  78/46/9629 271 (H)  07/04/2018 306 (H)   Triglycerides (mg/dl)  Date Value  52/84/1324 214 (H)  08/22/2   Flu-like symptoms 02/24/2021   GERD (gastroesophageal reflux disease)  04/03/2018   Last Assessment & Plan:  Formatting of this note might be different from the original. Assessment Chronic and stable No compliance issue noted with today visit  Plan Continue current treatment regimen Reviewed risk of GERD Reviewed principals of treatment Patient medication Prilosec will be continued  Last Assessment & Plan:  Formatting of this note might be different from the original. Problem co   H/O total hysterectomy 08/09/2016   Headache    History of anaphylaxis 01/20/2015   History of skin cancer 10/03/2018   Last Assessment & Plan:  Formatting of this note might be different from the original. 2 small area of concern, on forehead and to the left of the nose on the bridge. No drainage. Red, irritated skin. 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 Formatting of this note might be different from the original. 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 Problem Complexity:  Chronic problem/illness, stable  Pertinent Data: LDL Calculated (mg/dL)  Date Value  91/47/8295 223 (H)   Cholesterol (mg/dl)  Date Value  62/13/0865 310 (H)   HDL (mg/dl)  Date Value  78/46/9629 56.5   Triglycerides (mg/dl)  Date Value  52/84/1324 151 (H)   AST (U/L)  Date Value  01/09/2020 16      Long term use of drug 05/01/2019   Last Assessment & Plan:  Formatting of this note might be different from the original. We reviewed age, sex, and risk factor related health maintenance interventions, largely based on the current USPSTF grade A and B recommendations.these included High blood pressure screening, diet and exercise counseling and immunization review and recommendations.   Neck pain     Neuromuscular disorder (HCC)    fibromyalgia   Nonintractable headache 08/05/2018   Osteoporosis    Palpitations 12/27/2018   Paresthesia 10/06/2020   Postmenopausal state 12/29/2021   Screen for colon cancer 07/06/2018   Last Assessment & Plan:  Formatting of this note might be different from the original. FOBT cards given   Serum sickness 05/06/2020   Last Assessment & Plan:  Formatting of this note might be different from the original. She is doing very well and is followed closely with [facility]  Last Assessment & Plan:  Formatting of this note might be different from the original. Doing well and follows with [facility].   Sore throat 02/24/2021   Suspected 2019 novel coronavirus infection 02/24/2021   Tinnitus 01/12/2022   Vaccine counseling 01/26/2022   Last Assessment & Plan:  Formatting of this note might be different from the original. Advised to get the pneumonia vaccine which is recommended by the CDC's ACIP. Advised to get the Shingrix vaccine (shingles) which is also recommended by the CDC's ACIP, which may or may not be covered by her Medicare part D plan and which we do not stock at this office given the uncertainty regarding reimburseme   Vertigo 01/12/2022   Vitamin D deficiency 01/12/2022   Vocal cord dysfunction 09/16/2013    Past Surgical History:  Procedure Laterality Date   ABDOMINAL HYSTERECTOMY     EYE SURGERY Right    HERNIA REPAIR     KNEE SURGERY Right    WRIST SURGERY Right    left wrist in 2023    FAMHx:  Family History  Problem Relation Age of Onset   Dementia Mother        heavy smoker   Heart failure Mother    Colon cancer Mother 66   Basal cell carcinoma Mother 48   Congestive Heart Failure Father    Diabetes Sister    Hyperlipidemia Sister    Heart disease Sister    Diabetes Sister    Hyperlipidemia Sister    Cervical cancer Sister    Hyperlipidemia Brother    Heart attack Brother    Stroke Brother    Seizures Brother     Basal cell carcinoma Brother    Stroke Brother    Hyperlipidemia Brother    Heart defect Daughter    Colon cancer Daughter 41       stage 3  Colon polyps Son    Colon cancer Maternal Aunt 80 - 64   Colon cancer Paternal Uncle 61 - 59   Breast cancer Cousin        6 maternal second cousins   Breast cancer Other        maternal first cousin once removed- female breast cancer   Stomach cancer Neg Hx    Esophageal cancer Neg Hx     SOCHx:   reports that she has never smoked. She has never used smokeless tobacco. She reports that she does not drink alcohol and does not use drugs.  ALLERGIES:  Allergies  Allergen Reactions   Bee Venom Other (See Comments)    Serum sickness reported with insect bite. Has been treated with prednisone only in the past per patient.  Also, fire ants.  Anaphylaxis reaction.   Benzonatate Other (See Comments)    Cannot take due due to all "caine" allergies   Cellulose Shortness Of Breath   Covid-19 (Mrna) Vaccine Anaphylaxis   Lidocaine Anaphylaxis    ALL CAINES PER PATIENT.   Peppermint Oil Anaphylaxis   Shellfish Allergy Anaphylaxis and Other (See Comments)    Pt Doesn't remember   Famotidine    Atorvastatin Other (See Comments)    Dementia type symptoms, pain    Repatha [Evolocumab] Hives and Itching   Rosuvastatin Other (See Comments)    Dementia type symptoms, pain     ROS: Pertinent items noted in HPI and remainder of comprehensive ROS otherwise negative.  HOME MEDS: Current Outpatient Medications on File Prior to Visit  Medication Sig Dispense Refill   acyclovir (ZOVIRAX) 400 MG tablet Take 1 tablet by mouth as needed (Out breaks).     aspirin 81 MG tablet Take 81 mg by mouth daily.     docusate sodium (COLACE) 100 MG capsule Take 100 mg by mouth daily as needed for moderate constipation.     EPINEPHrine 0.3 mg/0.3 mL IJ SOAJ injection Inject 0.3 mg into the muscle as needed for anaphylaxis.  1   ezetimibe (ZETIA) 10 MG tablet Take  1 tablet by mouth once daily 90 tablet 0   inclisiran (LEQVIO) 284 MG/1.5ML SOSY injection Inject 284 mg into the skin once.     omeprazole (PRILOSEC OTC) 20 MG tablet Take 20 mg by mouth 2 (two) times daily.     senna (SENOKOT) 8.6 MG tablet Take 1 tablet by mouth daily. As needed     No current facility-administered medications on file prior to visit.    LABS/IMAGING: No results found for this or any previous visit (from the past 48 hour(s)).  No results found.  LIPID PANEL:    Component Value Date/Time   CHOL 179 10/19/2023 0740   TRIG 196 (H) 10/19/2023 0740   HDL 58 10/19/2023 0740   CHOLHDL 3.1 10/19/2023 0740   LDLCALC 88 10/19/2023 0740    WEIGHTS: Wt Readings from Last 3 Encounters:  10/23/23 143 lb 14.4 oz (65.3 kg)  09/06/23 148 lb (67.1 kg)  08/08/23 153 lb 8 oz (69.6 kg)    VITALS: BP 126/80 (BP Location: Left Arm, Patient Position: Sitting, Cuff Size: Normal)   Pulse 72   Ht 5\' 8"  (1.727 m)   Wt 143 lb 14.4 oz (65.3 kg)   SpO2 98%   BMI 21.88 kg/m   EXAM: Deferred  EKG: Deferred  ASSESSMENT: Possible familial hyperlipidemia-Dutch score 8 Statin intolerance-memory loss Family history of premature stroke  PLAN: 1.   Rebecca Montgomery has  had a recent increase in her cholesterol.  This may be due to dietary or more likely lifestyle factors.  She remains on therapy.  She has another Leqvio injection in March.  I would advise a repeat lipids in 6 months and at that point if her cholesterol remains elevated we may need to consider additional treatments.  Hopefully she will be able to improve it somewhat by then.  She has a follow-up with her primary cardiologist in January.  Chrystie Nose, MD, [facility], FACP  [place]  [facility]  Medical Director of the [facility] Lipid Disorders &  Cardiovascular Risk Reduction Clinic Diplomate of the American Board of Clinical Lipidology Attending Cardiologist  Direct Dial: [phone]  Fax: [phone]   Website:  [email or web address] 10/23/2023, 4:09 PM

## 2023-10-25 ENCOUNTER — Encounter (HOSPITAL_BASED_OUTPATIENT_CLINIC_OR_DEPARTMENT_OTHER): Payer: Self-pay | Admitting: Internal Medicine

## 2023-10-30 ENCOUNTER — Other Ambulatory Visit: Payer: Self-pay | Admitting: Internal Medicine

## 2023-12-07 ENCOUNTER — Encounter: Payer: Self-pay | Admitting: Cardiology

## 2023-12-07 ENCOUNTER — Ambulatory Visit: Payer: Medicare PPO | Attending: Cardiology | Admitting: Cardiology

## 2023-12-07 VITALS — BP 128/80 | HR 98 | Ht 68.0 in | Wt 149.4 lb

## 2023-12-07 DIAGNOSIS — K219 Gastro-esophageal reflux disease without esophagitis: Secondary | ICD-10-CM | POA: Diagnosis not present

## 2023-12-07 DIAGNOSIS — I10 Essential (primary) hypertension: Secondary | ICD-10-CM | POA: Diagnosis not present

## 2023-12-07 DIAGNOSIS — E785 Hyperlipidemia, unspecified: Secondary | ICD-10-CM

## 2023-12-07 NOTE — Progress Notes (Addendum)
Cardiology Office Note:    Date:  12/07/2023   ID:  [name], [name] 1956-08-07, MRN [id number]  PCP:  Georgina Quint, MD  Cardiologist:  Gypsy Balsam, MD    Referring MD: Georgina Quint, *   No chief complaint on file.   History of Present Illness:    Rebecca Montgomery is a 68 y.o. female past medical history significant for essential hypertension, dyslipidemia intolerance to multiple statin now on Leqvio and Zetia.  Comes today to months for follow-up treacherously strikes.  Her 56 years old daughter had colon cancer.  She required surgery chemotherapy and radiation likely if she was and is to be cancer free.  She will travel many times to [place] to take care of her grandchildren and taking care of her daughter she goes there about twice a month.  Obviously very stressed out but also very relieved by the fact that her daughter is cancer free.  Denies have any chest pain tightness squeezing pressure burning chest no palpitation dizziness swelling of lower extremities overall cardiac wise doing well  Past Medical History:  Diagnosis Date   Acid reflux    Acute conjunctivitis of left eye 02/24/2021   Arthritis    Atypical chest pain 12/27/2018   Back pain    Basal cell adenocarcinoma    Body mass index (BMI) of 23.0 to 23.9 in adult 01/26/2022   Last Assessment & Plan:  Formatting of this note might be different from the original. BMI Assessment: Current Body mass index is 23.02 kg/m.  Patient BMI currently is in the acceptable range (>=18.5 and < 25 kg/m2) . Current barriers to healthy weight management include none.  BMI Plan:  Today, Rebecca Montgomery and I have discussed methods to help address her current weight.   No new intervention is indica   Cellulitis of left lower extremity 01/26/2020   Last Assessment & Plan:  Formatting of this note might be different from the original. Mild swelling and pain on examination. No drainage. 2 other small areas just on each side of the  original area.  Will treat with antibiotic. She is to notify if not improving.   Cervical cancer screening 01/24/2021   Last Assessment & Plan:  Formatting of this note might be different from the original. She requested deference of gyn exam with pap today due to right hand fx. She will call to reschedule this at a later date.   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 Pertinent Data:   Current medication includes: diet modification and exercise This patient does not have an active medication from one of the medication groupers..  Last Lipid Values Cholesterol (mg/dl)  Date Value  29/56/2130 271 (H)  07/04/2018 306 (H)   Triglycerides (mg/dl)  Date Value  86/57/8469 214 (H)  08/22/2   Flu-like symptoms 02/24/2021   GERD (gastroesophageal reflux disease) 04/03/2018   Last Assessment & Plan:  Formatting of this note might be different from the original. Assessment Chronic and stable No compliance issue noted with today visit  Plan Continue current treatment regimen Reviewed risk of GERD Reviewed principals of treatment Patient medication Prilosec will be continued  Last Assessment & Plan:  Formatting of this note might be different from the original. Problem co   H/O total hysterectomy 08/09/2016   Headache  History of anaphylaxis 01/20/2015   History of skin cancer 10/03/2018   Last Assessment & Plan:  Formatting of this note might be different from the original. 2 small area of concern, on forehead and to the left of the nose on the bridge. No drainage. Red, irritated skin. 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  Formatting of this note might be different from the original. 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 Problem Complexity:  Chronic problem/illness, stable  Pertinent Data: LDL Calculated (mg/dL)  Date Value  95/62/1308 223 (H)   Cholesterol (mg/dl)  Date Value  65/78/4696 310 (H)   HDL (mg/dl)  Date Value  29/52/8413 56.5   Triglycerides (mg/dl)  Date Value  24/40/1027 151 (H)   AST (U/L)  Date Value  01/09/2020 16      Long term use of drug 05/01/2019   Last Assessment & Plan:  Formatting of this note might be different from the original. We reviewed age, sex, and risk factor related health maintenance interventions, largely based on the current USPSTF grade A and B recommendations.these included High blood pressure screening, diet and exercise counseling and immunization review and recommendations.   Neck pain    Neuromuscular disorder (HCC)    fibromyalgia   Nonintractable headache 08/05/2018   Osteoporosis    Palpitations 12/27/2018   Paresthesia 10/06/2020   Postmenopausal state 12/29/2021   Screen for colon cancer 07/06/2018   Last Assessment & Plan:  Formatting of this note might be different from the original. FOBT cards given   Serum sickness 05/06/2020   Last Assessment & Plan:  Formatting of this note might be different from the original. She is doing very well and is followed closely with [facility]  Last Assessment & Plan:  Formatting of this note might be different from the original. Doing well and follows with [facility].   Sore throat 02/24/2021   Suspected 2019 novel coronavirus infection 02/24/2021   Tinnitus 01/12/2022   Vaccine counseling 01/26/2022   Last Assessment & Plan:  Formatting of this note might be different from the original. Advised to get the pneumonia vaccine which is recommended by the CDC's ACIP. Advised to get the  Shingrix vaccine (shingles) which is also recommended by the CDC's ACIP, which may or may not be covered by her Medicare part D plan and which we do not stock at this office given the uncertainty regarding reimburseme   Vertigo 01/12/2022   Vitamin D deficiency 01/12/2022   Vocal cord dysfunction 09/16/2013    Past Surgical History:  Procedure Laterality Date   ABDOMINAL HYSTERECTOMY     EYE SURGERY Right    HERNIA REPAIR     KNEE SURGERY Right    WRIST SURGERY Right    left wrist in 2023    Current Medications: Current Meds  Medication Sig   acyclovir (ZOVIRAX) 400 MG tablet Take 1 tablet by mouth as needed (Out breaks).   aspirin 81 MG tablet Take 81 mg by mouth daily.   docusate sodium (COLACE) 100 MG capsule Take 100 mg by mouth daily as needed for moderate constipation.   EPINEPHrine 0.3 mg/0.3 mL IJ SOAJ injection Inject 0.3 mg  into the muscle as needed for anaphylaxis.   ezetimibe (ZETIA) 10 MG tablet Take 1 tablet by mouth once daily   inclisiran (LEQVIO) 284 MG/1.5ML SOSY injection Inject 284 mg into the skin once.   omeprazole (PRILOSEC OTC) 20 MG tablet Take 20 mg by mouth 2 (two) times daily.   senna (SENOKOT) 8.6 MG tablet Take 1 tablet by mouth daily. As needed     Allergies:   Bee venom, Benzonatate, Cellulose, Covid-19 (mrna) vaccine, Lidocaine, Peppermint oil, Shellfish allergy, Famotidine, Atorvastatin, Repatha [evolocumab], and Rosuvastatin   Social History   Socioeconomic History   Marital status: Divorced    Spouse name: Not on file   Number of children: 2   Years of education: masters   Highest education level: Master's degree (e.g., MA, MS, MEng, MEd, MSW, MBA)  Occupational History   Occupation: Retired  Tobacco Use   Smoking status: Never   Smokeless tobacco: Never  Vaping Use   Vaping status: Never Used  Substance and Sexual Activity   Alcohol use: Never   Drug use: Never   Sexual activity: Not on file  Other Topics Concern   Not on file   Social History Narrative   Lives at home alone.   Right-handed.   Glass of tea in the morning.    Social Drivers of Corporate investment banker Strain: Low Risk  (08/08/2023)   Overall Financial Resource Strain (CARDIA)    Difficulty of Paying Living Expenses: Not hard at all  Food Insecurity: No Food Insecurity (08/08/2023)   Hunger Vital Sign    Worried About Running Out of Food in the Last Year: Never true    Ran Out of Food in the Last Year: Never true  Transportation Needs: No Transportation Needs (08/08/2023)   PRAPARE - Administrator, Civil Service (Medical): No    Lack of Transportation (Non-Medical): No  Physical Activity: Sufficiently Active (08/08/2023)   Exercise Vital Sign    Days of Exercise per Week: 7 days    Minutes of Exercise per Session: 60 min  Stress: No Stress Concern Present (08/08/2023)   [employer] of Occupational Health - Occupational Stress Questionnaire    Feeling of Stress : Not at all  Social Connections: Moderately Integrated (08/08/2023)   Social Connection and Isolation Panel [NHANES]    Frequency of Communication with Friends and Family: More than three times a week    Frequency of Social Gatherings with Friends and Family: More than three times a week    Attends Religious Services: More than 4 times per year    Active Member of [employer] or Organizations: Yes    Attends Engineer, structural: More than 4 times per year    Marital Status: Divorced     Family History: The patient's family history includes Basal cell carcinoma in her brother; Basal cell carcinoma (age of onset: 63) in her mother; Breast cancer in her cousin and another family member; Cervical cancer in her sister; Colon cancer (age of onset: 24) in her daughter; Colon cancer (age of onset: 23 - 44) in her paternal uncle; Colon cancer (age of onset: 29 - 53) in her maternal aunt; Colon cancer (age of onset: 51) in her mother; Colon polyps in her son; Congestive  Heart Failure in her father; Dementia in her mother; Diabetes in her sister and sister; Heart attack in her brother; Heart defect in her daughter; Heart disease in her sister; Heart failure in her mother; Hyperlipidemia in  her brother, brother, sister, and sister; Seizures in her brother; Stroke in her brother and brother. There is no history of Stomach cancer or Esophageal cancer. ROS:   Please see the history of present illness.    All 14 point review of systems negative except as described per history of present illness  EKGs/Labs/Other Studies Reviewed:         Recent Labs: 02/05/2023: ALT 19; BUN 12; Creatinine, Ser 0.84; Hemoglobin 13.1; Platelets 368.0; Potassium 3.8; Sodium 136; TSH 1.78  Recent Lipid Panel    Component Value Date/Time   CHOL 179 10/19/2023 0740   TRIG 196 (H) 10/19/2023 0740   HDL 58 10/19/2023 0740   CHOLHDL 3.1 10/19/2023 0740   LDLCALC 88 10/19/2023 0740    Physical Exam:    VS:  BP 128/80 (BP Location: Right Arm, Patient Position: Sitting)   Pulse 98   Ht 5\' 8"  (1.727 m)   Wt 149 lb 6.4 oz (67.8 kg)   SpO2 100%   BMI 22.72 kg/m     Wt Readings from Last 3 Encounters:  12/07/23 149 lb 6.4 oz (67.8 kg)  10/23/23 143 lb 14.4 oz (65.3 kg)  09/06/23 148 lb (67.1 kg)     GEN:  Well nourished, well developed in no acute distress HEENT: Normal NECK: No JVD; No carotid bruits LYMPHATICS: No lymphadenopathy CARDIAC: RRR, no murmurs, no rubs, no gallops RESPIRATORY:  Clear to auscultation without rales, wheezing or rhonchi  ABDOMEN: Soft, non-tender, non-distended MUSCULOSKELETAL:  No edema; No deformity  SKIN: Warm and dry LOWER EXTREMITIES: no swelling NEUROLOGIC:  Alert and oriented x 3 PSYCHIATRIC:  Normal affect   ASSESSMENT:    1. Essential hypertension   2. Gastroesophageal reflux disease without esophagitis   3. Dyslipidemia    PLAN:    In order of problems listed above:  Essential hypertension blood pressure well-controlled  continue present management. History of gastroesophageal reflux disease.  Stable from that point review. Dyslipidemia I did review K PN her LDL is 88 HDL 58 continue present management   Medication Adjustments/Labs and Tests Ordered: Current medicines are reviewed at length with the patient today.  Concerns regarding medicines are outlined above.  No orders of the defined types were placed in this encounter.  Medication changes: No orders of the defined types were placed in this encounter.       Signed, Georgeanna Lea, MD, [facility] 12/07/2023 4:46 PM    [facility]

## 2023-12-07 NOTE — Patient Instructions (Signed)
Medication Instructions:  Your physician recommends that you continue on your current medications as directed. Please refer to the Current Medication list given to you today.  *If you need a refill on your cardiac medications before your next appointment, please call your pharmacy*   Lab Work: None Ordered If you have labs (blood work) drawn today and your tests are completely normal, you will receive your results only by: MyChart Message (if you have MyChart) OR A paper copy in the mail If you have any lab test that is abnormal or we need to change your treatment, we will call you to review the results.   Testing/Procedures: None Ordered   Follow-Up: At [facility], you and your health needs are our priority.  As part of our continuing mission to provide you with exceptional heart care, we have created designated Provider Care Teams.  These Care Teams include your primary Cardiologist (physician) and Advanced Practice Providers (APPs -  Physician Assistants and Nurse Practitioners) who all work together to provide you with the care you need, when you need it.  We recommend signing up for the patient portal called "MyChart".  Sign up information is provided on this After Visit Summary.  MyChart is used to connect with patients for Virtual Visits (Telemedicine).  Patients are able to view lab/test results, encounter notes, upcoming appointments, etc.  Non-urgent messages can be sent to your provider as well.   To learn more about what you can do with MyChart, go to [email or web address].    Your next appointment:   12 month(s)  The format for your next appointment:   In Person  Provider:   Gypsy Balsam, MD    Other Instructions NA

## 2023-12-10 ENCOUNTER — Encounter: Payer: Self-pay | Admitting: Internal Medicine

## 2024-01-21 ENCOUNTER — Ambulatory Visit (INDEPENDENT_AMBULATORY_CARE_PROVIDER_SITE_OTHER): Payer: Medicare PPO

## 2024-01-21 VITALS — BP 128/71 | HR 69 | Temp 98.0°F | Resp 16 | Ht 68.0 in | Wt 148.0 lb

## 2024-01-21 DIAGNOSIS — E7849 Other hyperlipidemia: Secondary | ICD-10-CM | POA: Diagnosis not present

## 2024-01-21 MED ORDER — INCLISIRAN SODIUM 284 MG/1.5ML ~~LOC~~ SOSY
284.0000 mg | PREFILLED_SYRINGE | Freq: Once | SUBCUTANEOUS | Status: AC
  Administered 2024-01-21: 284 mg via SUBCUTANEOUS
  Filled 2024-01-21: qty 1.5

## 2024-01-21 NOTE — Progress Notes (Signed)
 Diagnosis: Hyperlipidemia  Provider:  Chilton Greathouse MD  Procedure: Injection  Leqvio (inclisiran), Dose: 284 mg, Site: subcutaneous, Number of injections: 1  Injection Site(s): Right arm  Post Care:  right arm injection  Discharge: Condition: Good, Destination: Home . AVS Declined  Performed by:  Rico Ala, LPN

## 2024-02-05 ENCOUNTER — Telehealth: Admitting: Emergency Medicine

## 2024-02-05 ENCOUNTER — Encounter: Payer: Self-pay | Admitting: Emergency Medicine

## 2024-02-05 ENCOUNTER — Ambulatory Visit: Payer: Medicare PPO | Admitting: Emergency Medicine

## 2024-02-05 DIAGNOSIS — E785 Hyperlipidemia, unspecified: Secondary | ICD-10-CM | POA: Diagnosis not present

## 2024-02-05 DIAGNOSIS — K219 Gastro-esophageal reflux disease without esophagitis: Secondary | ICD-10-CM

## 2024-02-05 DIAGNOSIS — U071 COVID-19: Secondary | ICD-10-CM

## 2024-02-05 DIAGNOSIS — I10 Essential (primary) hypertension: Secondary | ICD-10-CM

## 2024-02-05 NOTE — Assessment & Plan Note (Signed)
 Normal blood pressure readings at home Off medications Cardiovascular risks associated with hypertension discussed Eating well and staying physically active No concerns at present time

## 2024-02-05 NOTE — Assessment & Plan Note (Signed)
Stable chronic condition Continues Leqvio 284 mg injections Continue Zetia 10 mg daily Diet and nutrition discussed

## 2024-02-05 NOTE — Progress Notes (Signed)
 Telemedicine Encounter- SOAP NOTE Established Patient MyChart video encounter Patient: Home  Provider: Office   Patient present only  This video encounter was conducted with the patient's (or proxy's) verbal consent via video telecommunications: yes/no: Yes Patient was instructed to have this encounter in a suitably private space; and to only have persons present to whom they give permission to participate. In addition, patient identity was confirmed by use of name plus two identifiers (DOB and address).  Chief complaint: Hypertension follow-up.  Unable to come to the office due to COVID infection Subjective  Rebecca Montgomery is a 68 y.o. female established patient.  Visit today for follow-up of chronic medical conditions including hypertension. Recently tested positive for COVID.  Flulike symptoms getting better. Recently seen at urgent care clinic and treated for sinus infection with Augmentin and prednisone Normal blood pressure readings at home No other complaints or medical concerns today. HPI ? 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 BMI Assessment: Current Body mass index is 23.02 kg/m.  Patient BMI currently is in the acceptable range (>=18.5 and < 25 kg/m2) . Current barriers to healthy weight management include none.  BMI Plan:  Today, Jahaira and I have discussed methods to help address her current weight.   No new intervention is indica   Cellulitis of left lower extremity 01/26/2020   Last Assessment & Plan:  Formatting of this note might be different from the original. Mild swelling and pain on examination. No drainage. 2 other small areas just on each side of the original area.  Will treat with antibiotic. She is to notify if not improving.   Cervical cancer screening 01/24/2021   Last Assessment & Plan:  Formatting of this note might be different from the original. She requested deference of gyn exam with pap today due to right hand fx. She will call to reschedule this at a later date.   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 Pertinent Data:   Current medication includes: diet modification and exercise This patient does not have an active medication from one of the medication groupers..  Last Lipid Values Cholesterol (mg/dl)  Date Value  04/29/2019 271 (H)  07/04/2018 306 (H)   Triglycerides (mg/dl)  Date Value  16/08/9603 214 (H)  08/22/2   Flu-like symptoms 02/24/2021   GERD (gastroesophageal reflux disease) 04/03/2018   Last Assessment & Plan:  Formatting of this note might be different from the original. Assessment Chronic and stable No compliance issue noted with today visit  Plan Continue  current treatment regimen Reviewed risk of GERD Reviewed principals of treatment Patient medication Prilosec will be continued  Last Assessment & Plan:  Formatting of this note might be different from the original. Problem co   H/O total hysterectomy 08/09/2016   Headache    History of anaphylaxis 01/20/2015   History of skin cancer 10/03/2018   Last Assessment & Plan:  Formatting of this note might be different from the original. 2 small area of concern, on forehead and to the left of the nose on the bridge. No drainage. Red, irritated skin. 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 Formatting of this note might be different from the original. 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 Problem Complexity:  Chronic problem/illness, stable  Pertinent Data: LDL Calculated (mg/dL)  Date Value  54/07/8118 223 (H)   Cholesterol (mg/dl)  Date Value  14/78/2956 310 (H)   HDL (mg/dl)  Date Value  21/30/8657 56.5   Triglycerides (mg/dl)  Date Value  84/69/6295 151 (H)   AST (U/L)  Date Value  01/09/2020 16      Long term use of drug 05/01/2019   Last Assessment & Plan:  Formatting of this note might be different from the original. We reviewed age, sex, and risk factor related health maintenance interventions, largely based on the current USPSTF grade A and B recommendations.these included High blood pressure screening, diet and exercise counseling and immunization review and recommendations.   Neck pain    Neuromuscular disorder (HCC)    fibromyalgia   Nonintractable headache 08/05/2018   Osteoporosis    Palpitations 12/27/2018   Paresthesia 10/06/2020   Postmenopausal state 12/29/2021    Screen for colon cancer 07/06/2018   Last Assessment & Plan:  Formatting of this note might be different from the original. FOBT cards given   Serum sickness 05/06/2020   Last Assessment & Plan:  Formatting of this note might be different from the original. She is doing very well and is followed closely with [facility]  Last Assessment & Plan:  Formatting of this note might be different from the original. Doing well and follows with [facility].   Sore throat 02/24/2021   Suspected 2019 novel coronavirus infection 02/24/2021   Tinnitus 01/12/2022   Vaccine counseling 01/26/2022   Last Assessment & Plan:  Formatting of this note might be different from the original. Advised to get the pneumonia vaccine which is recommended by the CDC's ACIP. Advised to get the Shingrix vaccine (shingles) which is also recommended by the CDC's ACIP, which may or may not be covered by her Medicare part D plan and which we do not stock at this office given the uncertainty regarding reimburseme   Vertigo 01/12/2022   Vitamin D deficiency 01/12/2022   Vocal cord dysfunction 09/16/2013   Current Outpatient Medications  Medication Sig Dispense Refill   acyclovir (ZOVIRAX) 400 MG tablet Take 1 tablet by mouth as needed (Out breaks).     aspirin 81 MG tablet Take 81 mg by mouth daily.     docusate sodium (COLACE) 100 MG capsule Take 100 mg by mouth daily as needed for moderate constipation.     EPINEPHrine 0.3 mg/0.3 mL IJ SOAJ injection Inject 0.3 mg into the muscle as needed for anaphylaxis.  1   ezetimibe (ZETIA) 10 MG tablet Take 1 tablet by mouth once daily 90 tablet 3   inclisiran (LEQVIO) 284 MG/1.5ML SOSY injection Inject 284 mg into the skin once.     omeprazole (PRILOSEC OTC) 20 MG tablet Take 20 mg by mouth 2 (two) times daily.     senna (SENOKOT) 8.6 MG tablet Take 1 tablet by mouth daily. As needed     No current facility-administered medications for this visit.   Allergies  Allergen  Reactions   Bee Venom Other (See Comments)    Serum sickness reported with insect bite. Has been treated with prednisone only in the past per patient.  Also, fire ants.  Anaphylaxis reaction.   Benzonatate Other (See Comments)    Cannot take due due to all "caine" allergies   Cellulose Shortness Of Breath   Covid-19 (Mrna) Vaccine Anaphylaxis   Lidocaine Anaphylaxis    ALL CAINES PER PATIENT.   Peppermint Oil Anaphylaxis   Shellfish Allergy Anaphylaxis and Other (See Comments)    Pt Doesn't remember   Famotidine    Atorvastatin Other (See Comments)    Dementia type symptoms, pain    Repatha [Evolocumab] Hives and Itching   Rosuvastatin Other (See Comments)    Dementia type symptoms, pain    Social History   Socioeconomic History   Marital status: Divorced    Spouse name: Not on file   Number of children: 2   Years of education: masters   Highest education level: Master's degree (e.g., MA, MS, MEng, MEd, MSW, MBA)  Occupational History   Occupation: Retired  Tobacco Use   Smoking status: Never   Smokeless tobacco: Never  Vaping Use   Vaping status: Never Used  Substance and Sexual Activity   Alcohol use: Never   Drug use: Never   Sexual activity: Not on file  Other Topics Concern   Not on file  Social History Narrative   Lives at home alone.   Right-handed.   Glass of tea in the morning.    Social Drivers of Corporate investment banker Strain: Low Risk  (02/04/2024)   Overall Financial Resource Strain (CARDIA)    Difficulty of Paying Living Expenses: Not hard at all  Food Insecurity: No Food Insecurity (02/04/2024)   Hunger Vital Sign    Worried About Running Out of Food in the Last Year: Never true    Ran Out of Food in the Last Year: Never true  Transportation Needs: No Transportation Needs (02/04/2024)   PRAPARE - Administrator, Civil Service (Medical): No    Lack of Transportation (Non-Medical): No  Physical Activity: Sufficiently Active  (02/04/2024)   Exercise Vital Sign    Days of Exercise per Week: 7 days    Minutes of Exercise per Session: 60 min  Stress: No Stress Concern Present (02/04/2024)   [employer] of Occupational Health - Occupational Stress Questionnaire    Feeling of Stress : Not at all  Social Connections: Moderately Integrated (02/04/2024)   Social Connection and Isolation  Panel [NHANES]    Frequency of Communication with Friends and Family: More than three times a week    Frequency of Social Gatherings with Friends and Family: More than three times a week    Attends Religious Services: More than 4 times per year    Active Member of [employer] or Organizations: Yes    Attends Banker Meetings: More than 4 times per year    Marital Status: Divorced  Intimate Partner Violence: Not At Risk (06/01/2023)   Humiliation, Afraid, Rape, and Kick questionnaire    Fear of Current or Ex-Partner: No    Emotionally Abused: No    Physically Abused: No    Sexually Abused: No   Review of Systems  Constitutional: Negative.  Negative for chills and fever.  HENT:  Positive for congestion. Negative for sore throat.   Respiratory:  Positive for cough. Negative for shortness of breath.   Cardiovascular: Negative.  Negative for chest pain and palpitations.  Gastrointestinal:  Negative for abdominal pain, diarrhea, nausea and vomiting.  Genitourinary: Negative.  Negative for dysuria and hematuria.  Skin: Negative.  Negative for rash.  Neurological: Negative.  Negative for dizziness and headaches.  All other systems reviewed and are negative.  Objective  Alert and oriented x 3 in no apparent respiratory distress. Vitals as reported by the patient: There were no vitals filed for this visit. Problem List Items Addressed This Visit       Cardiovascular and Mediastinum   Essential hypertension - Primary   Normal blood pressure readings at home Off medications Cardiovascular risks associated with hypertension  discussed Eating well and staying physically active No concerns at present time        Digestive   GERD (gastroesophageal reflux disease)   Stable and asymptomatic.  No concerns        Other   Dyslipidemia   Stable chronic condition Continues Leqvio 284 mg injections Continue Zetia 10 mg daily Diet and nutrition discussed      COVID-19 virus infection   Clinically stable.  Running its course without complications. Symptom management discussed. No concerns.       I discussed the assessment and treatment plan with the patient. The patient was provided an opportunity to ask questions and all were answered. The patient agreed with the plan and demonstrated an understanding of the instructions.   The patient was advised to call back or seek an in-person evaluation if the symptoms worsen or if the condition fails to improve as anticipated.  I provided 30 minutes of non-face-to-face time during this encounter including time spent reviewing last office visit notes, review of most recent blood work results, review of multiple chronic medical conditions under management, review of all medications, cardiovascular risks associated with dyslipidemia and hypertension, prognosis, documentation and need for follow-up  Dr. Edwina Barth, MD [facility]

## 2024-02-05 NOTE — Assessment & Plan Note (Signed)
Stable and asymptomatic.  No concerns.

## 2024-02-05 NOTE — Assessment & Plan Note (Signed)
 Clinically stable.  Running its course without complications. Symptom management discussed. No concerns.

## 2024-06-02 ENCOUNTER — Ambulatory Visit (INDEPENDENT_AMBULATORY_CARE_PROVIDER_SITE_OTHER)

## 2024-06-02 ENCOUNTER — Other Ambulatory Visit: Payer: Self-pay | Admitting: Emergency Medicine

## 2024-06-02 VITALS — Ht 68.0 in | Wt 148.0 lb

## 2024-06-02 DIAGNOSIS — Z0000 Encounter for general adult medical examination without abnormal findings: Secondary | ICD-10-CM

## 2024-06-02 DIAGNOSIS — Z1231 Encounter for screening mammogram for malignant neoplasm of breast: Secondary | ICD-10-CM

## 2024-06-02 NOTE — Patient Instructions (Signed)
 Rebecca Montgomery , Thank you for taking time out of your busy schedule to complete your Annual Wellness Visit with me. I enjoyed our conversation and look forward to speaking with you again next year. I, as well as your care team,  appreciate your ongoing commitment to your health goals. Please review the following plan we discussed and let me know if I can assist you in the future. Your Game plan/ To Do List    Referrals: If you haven't heard from the office you've been referred to, please reach out to them at the phone provided.  You have an order for:  [x]   3D Mammogram    Please call for appointment:  [facility] [address] [place], [place] [address] [phone]   Make sure to wear two-piece clothing.  No lotions, powders, or deodorants the day of the appointment. Make sure to bring picture ID and insurance card.  Bring list of medications you are currently taking including any supplements.   Follow up Visits: Next Medicare AWV with our clinical staff: 06/03/2025.   Have you seen your provider in the last 6 months (3 months if uncontrolled diabetes)? No Next Office Visit with your provider: Patient stated that she will call the office to schedule her next office visit.  Last one was on 08/08/2023.  Clinician Recommendations:  Aim for 30 minutes of exercise or brisk walking, 6-8 glasses of water, and 5 servings of fruits and vegetables each day. Please remember to schedule your next office visit for October.         This is a list of the screening recommended for you and due dates:  Health Maintenance  Topic Date Due   Hepatitis C Screening  Never done   Zoster (Shingles) Vaccine (1 of 2) Never done   COVID-19 Vaccine (2 - Moderna risk series) 02/09/2020   Pneumococcal Vaccine for age over 62 (1 of 1 - PCV) 08/07/2024*   Flu Shot  06/13/2024   DTaP/Tdap/Td vaccine (2 - Td or Tdap) 12/11/2024   Mammogram  05/31/2025   Medicare Annual Wellness Visit   06/02/2025   Colon Cancer Screening  01/24/2028   DEXA scan (bone density measurement)  Completed   Hepatitis B Vaccine  Aged Out   HPV Vaccine  Aged Out   Meningitis B Vaccine  Aged Out  *Topic was postponed. The date shown is not the original due date.    Advanced directives: (Copy Requested) Please bring a copy of your health care power of attorney and living will to the office to be added to your chart at your convenience. You can mail to [facility] [address] [place], [place] [address] or email to [email or web address] Advance Care Planning is important because it:  [x]  Makes sure you receive the medical care that is consistent with your values, goals, and preferences  [x]  It provides guidance to your family and loved ones and reduces their decisional burden about whether or not they are making the right decisions based on your wishes.  Follow the link provided in your after visit summary or read over the paperwork we have mailed to you to help you started getting your Advance Directives in place. If you need assistance in completing these, please reach out to us  so that we can help you!  See attachments for Preventive Care and Fall Prevention Tips.

## 2024-06-02 NOTE — Progress Notes (Signed)
 Subjective:   Rebecca Montgomery is a 68 y.o. who presents for a Medicare Wellness preventive visit.  As a reminder, Annual Wellness Visits don't include a physical exam, and some assessments may be limited, especially if this visit is performed virtually. We may recommend an in-person follow-up visit with your provider if needed.  Visit Complete: Virtual I connected with  Cristi E Mateus on 06/02/24 by a audio enabled telemedicine application and verified that I am speaking with the correct person using two identifiers.  Patient Location: Home  Provider Location: Home Office  I discussed the limitations of evaluation and management by telemedicine. The patient expressed understanding and agreed to proceed.  Vital Signs: Because this visit was a virtual/telehealth visit, some criteria may be missing or patient reported. Any vitals not documented were not able to be obtained and vitals that have been documented are patient reported.  VideoDeclined- This patient declined Librarian, academic. Therefore the visit was completed with audio only.  Persons Participating in Visit: Patient.  AWV Questionnaire: Yes: Patient Medicare AWV questionnaire was completed by the patient on 06/01/2024; I have confirmed that all information answered by patient is correct and no changes since this date.  Cardiac Risk Factors include: advanced age (>91men, >56 women);hypertension;dyslipidemia     Objective:    Today's Vitals   06/02/24 0846  Weight: 148 lb (67.1 kg)  Height: 5' 8 (1.727 m)   Body mass index is 22.5 kg/m.     06/02/2024    8:57 AM 06/01/2023    9:30 AM  Advanced Directives  Does Patient Have a Medical Advance Directive? Yes Yes  Type of Estate agent of [place];Living will Healthcare Power of [place];Living will  Copy of Healthcare Power of Attorney in Chart? No - copy requested No - copy requested    Current Medications  (verified) Outpatient Encounter Medications as of 06/02/2024  Medication Sig   acyclovir (ZOVIRAX) 400 MG tablet Take 1 tablet by mouth as needed (Out breaks).   albuterol (VENTOLIN HFA) 108 (90 Base) MCG/ACT inhaler Inhale 1 puff into the lungs every 6 (six) hours as needed.   aspirin 81 MG tablet Take 81 mg by mouth daily.   docusate sodium (COLACE) 100 MG capsule Take 100 mg by mouth daily as needed for moderate constipation.   EPINEPHrine 0.3 mg/0.3 mL IJ SOAJ injection Inject 0.3 mg into the muscle as needed for anaphylaxis.   ezetimibe  (ZETIA ) 10 MG tablet Take 1 tablet by mouth once daily   inclisiran (LEQVIO ) 284 MG/1.5ML SOSY injection Inject 284 mg into the skin once.   omeprazole  (PRILOSEC OTC) 20 MG tablet Take 20 mg by mouth 2 (two) times daily.   predniSONE (DELTASONE) 10 MG tablet Take 10 mg by mouth as needed.   senna (SENOKOT) 8.6 MG tablet Take 1 tablet by mouth daily. As needed   No facility-administered encounter medications on file as of 06/02/2024.    Allergies (verified) Bee venom, Benzonatate, Cellulose, Covid-19 (mrna) vaccine, Lidocaine, Peppermint oil, Shellfish allergy, Famotidine, Atorvastatin, Repatha  [evolocumab ], and Rosuvastatin   History: Past Medical History:  Diagnosis Date   Acid reflux    Acute conjunctivitis of left eye 02/24/2021   Arthritis    Atypical chest pain 12/27/2018   Back pain    Basal cell adenocarcinoma    Body mass index (BMI) of 23.0 to 23.9 in adult 01/26/2022   Last Assessment & Plan:  Formatting of this note might be different from the original.  BMI Assessment: Current Body mass index is 23.02 kg/m.  Patient BMI currently is in the acceptable range (>=18.5 and < 25 kg/m2) . Current barriers to healthy weight management include none.  BMI Plan:  Today, [name] and I have discussed methods to help address her current weight.   No new intervention is indica   Cellulitis of left lower extremity 01/26/2020   Last Assessment & Plan:   Formatting of this note might be different from the original. Mild swelling and pain on examination. No drainage. 2 other small areas just on each side of the original area.  Will treat with antibiotic. She is to notify if not improving.   Cervical cancer screening 01/24/2021   Last Assessment & Plan:  Formatting of this note might be different from the original. She requested deference of gyn exam with pap today due to right hand fx. She will call to reschedule this at a later date.   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 Pertinent Data:   Current medication includes: diet modification and exercise This patient does not have an active medication from one of the medication groupers..  Last Lipid Values Cholesterol (mg/dl)  Date Value  93/83/7979 271 (H)  07/04/2018 306 (H)   Triglycerides (mg/dl)  Date Value  93/83/7979 214 (H)  08/22/2   Flu-like symptoms 02/24/2021   GERD (gastroesophageal reflux disease) 04/03/2018   Last Assessment & Plan:  Formatting of this note might be different from the original. Assessment Chronic and stable No compliance issue noted with today visit  Plan Continue current treatment regimen Reviewed risk of GERD Reviewed principals of treatment Patient medication Prilosec will be continued  Last Assessment & Plan:  Formatting of this note might be different from the original. Problem co   H/O total hysterectomy 08/09/2016   Headache    History of anaphylaxis 01/20/2015   History of skin cancer 10/03/2018   Last Assessment & Plan:  Formatting of this note might be different from the original. 2 small area of concern, on forehead and to the left of the nose on the bridge. No drainage. Red, irritated skin. 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 Formatting of this note might be different from the original. 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 Problem Complexity:  Chronic problem/illness, stable  Pertinent Data: LDL Calculated (mg/dL)  Date Value  97/73/7978 223 (H)   Cholesterol (mg/dl)  Date Value  97/73/7978 310 (H)   HDL (mg/dl)  Date Value  97/73/7978 56.5   Triglycerides (mg/dl)  Date Value  97/73/7978 151 (H)   AST (U/L)  Date Value  01/09/2020 16      Long term use of drug 05/01/2019   Last Assessment & Plan:  Formatting of this note might be different from the original. We reviewed age, sex, and risk factor related health maintenance interventions, largely based on the current USPSTF grade A and B recommendations.these included High blood pressure screening, diet and exercise counseling and immunization review and recommendations.  Neck pain    Neuromuscular disorder (HCC)    fibromyalgia   Nonintractable headache 08/05/2018   Osteoporosis    Palpitations 12/27/2018   Paresthesia 10/06/2020   Postmenopausal state 12/29/2021   Screen for colon cancer 07/06/2018   Last Assessment & Plan:  Formatting of this note might be different from the original. FOBT cards given   Serum sickness 05/06/2020   Last Assessment & Plan:  Formatting of this note might be different from the original. She is doing very well and is followed closely with [facility]  Last Assessment & Plan:  Formatting of this note might be different from the original. Doing well and follows with [facility].   Sore throat 02/24/2021   Suspected 2019 novel coronavirus infection 02/24/2021   Tinnitus 01/12/2022   Vaccine counseling 01/26/2022   Last Assessment & Plan:   Formatting of this note might be different from the original. Advised to get the pneumonia vaccine which is recommended by the CDC's ACIP. Advised to get the Shingrix vaccine (shingles) which is also recommended by the CDC's ACIP, which may or may not be covered by her Medicare part D plan and which we do not stock at this office given the uncertainty regarding reimburseme   Vertigo 01/12/2022   Vitamin D deficiency 01/12/2022   Vocal cord dysfunction 09/16/2013   Past Surgical History:  Procedure Laterality Date   ABDOMINAL HYSTERECTOMY     EYE SURGERY Right    HERNIA REPAIR     KNEE SURGERY Right    WRIST SURGERY Right    left wrist in 2023   Family History  Problem Relation Age of Onset   Dementia Mother        heavy smoker   Heart failure Mother    Colon cancer Mother 38   Basal cell carcinoma Mother 10   Congestive Heart Failure Father    Diabetes Sister    Hyperlipidemia Sister    Heart disease Sister    Diabetes Sister    Hyperlipidemia Sister    Cervical cancer Sister    Hyperlipidemia Brother    Heart attack Brother    Stroke Brother    Seizures Brother    Basal cell carcinoma Brother    Stroke Brother    Hyperlipidemia Brother    Heart defect Daughter    Colon cancer Daughter 31       stage 3   Colon polyps Son    Colon cancer Maternal Aunt 69 - 64   Colon cancer Paternal Uncle 75 - 59   Breast cancer Cousin        6 maternal second cousins   Breast cancer Other        maternal first cousin once removed- female breast cancer   Stomach cancer Neg Hx    Esophageal cancer Neg Hx    Social History   Socioeconomic History   Marital status: Divorced    Spouse name: Not on file   Number of children: 2   Years of education: masters   Highest education level: Master's degree (e.g., MA, MS, MEng, MEd, MSW, MBA)  Occupational History   Occupation: Retired   Occupation: Environmental health practitioner time  Tobacco Use   Smoking status: Never   Smokeless tobacco: Never  Vaping  Use   Vaping status: Never Used  Substance and Sexual Activity   Alcohol use: Never   Drug use: Never   Sexual activity: Not on file  Other Topics Concern   Not on file  Social History Narrative   Lives at home alone.   Right-handed.   Glass of tea in the morning.    Social Drivers of Corporate investment banker Strain: Low Risk  (06/01/2024)   Overall Financial Resource Strain (CARDIA)    Difficulty of Paying Living Expenses: Not hard at all  Food Insecurity: No Food Insecurity (06/01/2024)   Hunger Vital Sign    Worried About Running Out of Food in the Last Year: Never true    Ran Out of Food in the Last Year: Never true  Transportation Needs: No Transportation Needs (06/01/2024)   PRAPARE - Administrator, Civil Service (Medical): No    Lack of Transportation (Non-Medical): No  Physical Activity: Sufficiently Active (06/01/2024)   Exercise Vital Sign    Days of Exercise per Week: 7 days    Minutes of Exercise per Session: 120 min  Stress: No Stress Concern Present (06/01/2024)   [employer] of Occupational Health - Occupational Stress Questionnaire    Feeling of Stress: Not at all  Social Connections: Moderately Integrated (06/01/2024)   Social Connection and Isolation Panel    Frequency of Communication with Friends and Family: More than three times a week    Frequency of Social Gatherings with Friends and Family: More than three times a week    Attends Religious Services: More than 4 times per year    Active Member of [employer] or Organizations: Yes    Attends Engineer, structural: More than 4 times per year    Marital Status: Divorced    Tobacco Counseling Counseling given: Not Answered    Clinical Intake:  Pre-visit preparation completed: Yes  Pain : No/denies pain     BMI - recorded: 22.5 Nutritional Status: BMI of 19-24  Normal Nutritional Risks: None Diabetes: No  Lab Results  Component Value Date   HGBA1C 5.9 02/05/2023      How often do you need to have someone help you when you read instructions, pamphlets, or other written materials from your doctor or pharmacy?: 1 - Never  Interpreter Needed?: No  Information entered by :: Aadith Raudenbush, RMA   Activities of Daily Living     06/01/2024    6:03 PM  In your present state of health, do you have any difficulty performing the following activities:  Hearing? 0  Comment has some hearing loss  Vision? 0  Difficulty concentrating or making decisions? 0  Walking or climbing stairs? 0  Dressing or bathing? 0  Doing errands, shopping? 0  Preparing Food and eating ? N  Using the Toilet? N  In the past six months, have you accidently leaked urine? N  Do you have problems with loss of bowel control? N  Managing your Medications? N  Managing your Finances? N  Housekeeping or managing your Housekeeping? N    Patient Care Team: Purcell Emil Schanz, MD as PCP - General (Internal Medicine) Gaspar Kung, MD as Referring Physician (Orthopedic Surgery) Jama No (Inactive) (Cardiology) Mona Vinie BROCKS, MD as Consulting Physician (Cardiology) Bernie Lamar PARAS, MD as Consulting Physician (Cardiology) [facility]  I have updated your Care Teams any recent Medical Services you may have received from other providers in the past year.     Assessment:   This is a routine wellness examination for [place].  Hearing/Vision screen Hearing Screening - Comments:: has some hearing loss Vision Screening - Comments:: Wears eyeglasses/Groat   Goals Addressed  This Visit's Progress     DIET - Healthy Eating Habits (pt-stated)   On track     Patient expressed that she would like to lose at least 10 lb.       Depression Screen     06/02/2024    8:59 AM 08/08/2023    3:49 PM 06/01/2023    9:40 AM 02/05/2023    3:53 PM 06/14/2022    1:09 PM  PHQ 2/9 Scores  PHQ - 2 Score 0 0 0 0 0  PHQ- 9 Score 0  1      Fall Risk      06/01/2024    6:03 PM 08/08/2023    3:49 PM 06/01/2023    9:32 AM 06/01/2023    9:14 AM 02/05/2023    3:53 PM  Fall Risk   Falls in the past year? 1 0 0 0 0  Number falls in past yr: 0 0 0  0  Injury with Fall? 0 0 0  0  Risk for fall due to :  No Fall Risks No Fall Risks  No Fall Risks  Follow up Falls evaluation completed;Falls prevention discussed Falls evaluation completed Falls prevention discussed  Falls evaluation completed    MEDICARE RISK AT HOME:  Medicare Risk at Home Any stairs in or around the home?: (Patient-Rptd) Yes If so, are there any without handrails?: (Patient-Rptd) No Home free of loose throw rugs in walkways, pet beds, electrical cords, etc?: (Patient-Rptd) Yes Adequate lighting in your home to reduce risk of falls?: (Patient-Rptd) Yes Life alert?: (Patient-Rptd) No Use of a cane, walker or w/c?: (Patient-Rptd) No Grab bars in the bathroom?: (Patient-Rptd) No Shower chair or bench in shower?: (Patient-Rptd) No Elevated toilet seat or a handicapped toilet?: (Patient-Rptd) No  TIMED UP AND GO:  Was the test performed?  No  Cognitive Function: Declined/Normal: No cognitive concerns noted by patient or family. Patient alert, oriented, able to answer questions appropriately and recall recent events. No signs of memory loss or confusion.    08/05/2018   10:30 AM  MMSE - Mini Mental State Exam  Orientation to time 5  Orientation to Place 5  Registration 3  Attention/ Calculation 5  Recall 2  Language- name 2 objects 2  Language- repeat 1  Language- follow 3 step command 3  Language- read & follow direction 1  Write a sentence 1  Copy design 1  Total score 29        06/01/2023    9:33 AM  6CIT Screen  What Year? 0 points  What month? 0 points  What time? 0 points  Count back from 20 0 points  Months in reverse 0 points  Repeat phrase 0 points  Total Score 0 points    Immunizations Immunization History  Administered Date(s) Administered    Moderna Sars-Covid-2 Vaccination 01/12/2020   PPD Test 10/07/2018   Tdap 12/11/2014    Screening Tests Health Maintenance  Topic Date Due   Hepatitis C Screening  Never done   Zoster Vaccines- Shingrix (1 of 2) Never done   COVID-19 Vaccine (2 - Moderna risk series) 02/09/2020   Pneumococcal Vaccine: 50+ Years (1 of 1 - PCV) 08/07/2024 (Originally 04/30/2006)   INFLUENZA VACCINE  06/13/2024   DTaP/Tdap/Td (2 - Td or Tdap) 12/11/2024   MAMMOGRAM  05/31/2025   Medicare Annual Wellness (AWV)  06/02/2025   Colonoscopy  01/24/2028   DEXA SCAN  Completed   Hepatitis B Vaccines  Aged Out  HPV VACCINES  Aged Out   Meningococcal B Vaccine  Aged Out    Health Maintenance  Health Maintenance Due  Topic Date Due   Hepatitis C Screening  Never done   Zoster Vaccines- Shingrix (1 of 2) Never done   COVID-19 Vaccine (2 - Moderna risk series) 02/09/2020   Health Maintenance Items Addressed: Labs Ordered: Hep C, See Nurse Notes at the end of this note  Additional Screening:  Vision Screening: Recommended annual ophthalmology exams for early detection of glaucoma and other disorders of the eye. Would you like a referral to an eye doctor? No    Dental Screening: Recommended annual dental exams for proper oral hygiene  Community Resource Referral / Chronic Care Management: CRR required this visit?  No   CCM required this visit?  No   Plan:    I have personally reviewed and noted the following in the patient's chart:   Medical and social history Use of alcohol, tobacco or illicit drugs  Current medications and supplements including opioid prescriptions. Patient is not currently taking opioid prescriptions. Functional ability and status Nutritional status Physical activity Advanced directives List of other physicians Hospitalizations, surgeries, and ER visits in previous 12 months Vitals Screenings to include cognitive, depression, and falls Referrals and appointments  In  addition, I have reviewed and discussed with patient certain preventive protocols, quality metrics, and best practice recommendations. A written personalized care plan for preventive services as well as general preventive health recommendations were provided to patient.   Acelyn Basham L Max Nuno, CMA   06/02/2024   After Visit Summary: (MyChart) Due to this being a telephonic visit, the after visit summary with patients personalized plan was offered to patient via MyChart   Notes: Patient declines any vaccines at this time.  Patient is due for a Hep C screening and would like to get that done during her next office visit.  She stated that she will call the office back to schedule her next visit.

## 2024-07-23 ENCOUNTER — Ambulatory Visit (INDEPENDENT_AMBULATORY_CARE_PROVIDER_SITE_OTHER)

## 2024-07-23 VITALS — BP 132/77 | HR 80 | Temp 98.1°F | Resp 14 | Ht 68.0 in | Wt 153.0 lb

## 2024-07-23 DIAGNOSIS — E7849 Other hyperlipidemia: Secondary | ICD-10-CM

## 2024-07-23 MED ORDER — INCLISIRAN SODIUM 284 MG/1.5ML ~~LOC~~ SOSY
284.0000 mg | PREFILLED_SYRINGE | Freq: Once | SUBCUTANEOUS | Status: AC
  Administered 2024-07-23: 284 mg via SUBCUTANEOUS
  Filled 2024-07-23: qty 1.5

## 2024-07-23 NOTE — Progress Notes (Signed)
 Diagnosis: Hyperlipidemia  Provider:  Chilton Greathouse MD  Procedure: Injection  Leqvio (inclisiran), Dose: 284 mg, Site: subcutaneous, Number of injections: 1  Injection Site(s): Left arm  Post Care: Patient declined observation  Discharge: Condition: Stable, Destination: Home . AVS Provided  Performed by:  Wyvonne Lenz, RN

## 2024-08-08 ENCOUNTER — Ambulatory Visit
Admission: RE | Admit: 2024-08-08 | Discharge: 2024-08-08 | Disposition: A | Discharge status: Home / Self Care | Source: Ambulatory Visit | Attending: Emergency Medicine | Admitting: Emergency Medicine

## 2024-08-08 DIAGNOSIS — Z1231 Encounter for screening mammogram for malignant neoplasm of breast: Secondary | ICD-10-CM

## 2024-08-13 ENCOUNTER — Encounter: Payer: Self-pay | Admitting: Emergency Medicine

## 2024-08-13 ENCOUNTER — Ambulatory Visit: Admitting: Emergency Medicine

## 2024-08-13 ENCOUNTER — Ambulatory Visit: Payer: Self-pay | Admitting: Emergency Medicine

## 2024-08-13 VITALS — BP 118/70 | HR 63 | Temp 98.1°F | Ht 68.0 in | Wt 146.2 lb

## 2024-08-13 DIAGNOSIS — E785 Hyperlipidemia, unspecified: Secondary | ICD-10-CM | POA: Diagnosis not present

## 2024-08-13 DIAGNOSIS — K219 Gastro-esophageal reflux disease without esophagitis: Secondary | ICD-10-CM | POA: Diagnosis not present

## 2024-08-13 DIAGNOSIS — E559 Vitamin D deficiency, unspecified: Secondary | ICD-10-CM | POA: Diagnosis not present

## 2024-08-13 DIAGNOSIS — I10 Essential (primary) hypertension: Secondary | ICD-10-CM | POA: Diagnosis not present

## 2024-08-13 NOTE — Assessment & Plan Note (Signed)
Stable and asymptomatic.  No concerns.

## 2024-08-13 NOTE — Patient Instructions (Signed)
 Health Maintenance After Age 68 After age 27, you are at a higher risk for certain long-term diseases and infections as well as injuries from falls. Falls are a major cause of broken bones and head injuries in people who are older than age 73. Getting regular preventive care can help to keep you healthy and well. Preventive care includes getting regular testing and making lifestyle changes as recommended by your health care provider. Talk with your health care provider about: Which screenings and tests you should have. A screening is a test that checks for a disease when you have no symptoms. A diet and exercise plan that is right for you. What should I know about screenings and tests to prevent falls? Screening and testing are the best ways to find a health problem early. Early diagnosis and treatment give you the best chance of managing medical conditions that are common after age 90. Certain conditions and lifestyle choices may make you more likely to have a fall. Your health care provider may recommend: Regular vision checks. Poor vision and conditions such as cataracts can make you more likely to have a fall. If you wear glasses, make sure to get your prescription updated if your vision changes. Medicine review. Work with your health care provider to regularly review all of the medicines you are taking, including over-the-counter medicines. Ask your health care provider about any side effects that may make you more likely to have a fall. Tell your health care provider if any medicines that you take make you feel dizzy or sleepy. Strength and balance checks. Your health care provider may recommend certain tests to check your strength and balance while standing, walking, or changing positions. Foot health exam. Foot pain and numbness, as well as not wearing proper footwear, can make you more likely to have a fall. Screenings, including: Osteoporosis screening. Osteoporosis is a condition that causes  the bones to get weaker and break more easily. Blood pressure screening. Blood pressure changes and medicines to control blood pressure can make you feel dizzy. Depression screening. You may be more likely to have a fall if you have a fear of falling, feel depressed, or feel unable to do activities that you used to do. Alcohol  use screening. Using too much alcohol  can affect your balance and may make you more likely to have a fall. Follow these instructions at home: Lifestyle Do not drink alcohol  if: Your health care provider tells you not to drink. If you drink alcohol : Limit how much you have to: 0-1 drink a day for women. 0-2 drinks a day for men. Know how much alcohol  is in your drink. In the [place] one drink equals one 12 oz bottle of beer (355 mL), one 5 oz glass of wine (148 mL), or one 1 oz glass of hard liquor (44 mL). Do not use any products that contain nicotine or tobacco. These products include cigarettes, chewing tobacco, and vaping devices, such as e-cigarettes. If you need help quitting, ask your health care provider. Activity  Follow a regular exercise program to stay fit. This will help you maintain your balance. Ask your health care provider what types of exercise are appropriate for you. If you need a cane or walker, use it as recommended by your health care provider. Wear supportive shoes that have nonskid soles. Safety  Remove any tripping hazards, such as rugs, cords, and clutter. Install safety equipment such as grab bars in bathrooms and safety rails on stairs. Keep rooms and walkways  well-lit. General instructions Talk with your health care provider about your risks for falling. Tell your health care provider if: You fall. Be sure to tell your health care provider about all falls, even ones that seem minor. You feel dizzy, tiredness (fatigue), or off-balance. Take over-the-counter and prescription medicines only as told by your health care provider. These include  supplements. Eat a healthy diet and maintain a healthy weight. A healthy diet includes low-fat dairy products, low-fat (lean) meats, and fiber from whole grains, beans, and lots of fruits and vegetables. Stay current with your vaccines. Schedule regular health, dental, and eye exams. Summary Having a healthy lifestyle and getting preventive care can help to protect your health and wellness after age 15. Screening and testing are the best way to find a health problem early and help you avoid having a fall. Early diagnosis and treatment give you the best chance for managing medical conditions that are more common for people who are older than age 42. Falls are a major cause of broken bones and head injuries in people who are older than age 64. Take precautions to prevent a fall at home. Work with your health care provider to learn what changes you can make to improve your health and wellness and to prevent falls. This information is not intended to replace advice given to you by your health care provider. Make sure you discuss any questions you have with your health care provider. Document Revised: 03/21/2021 Document Reviewed: 03/21/2021 Elsevier Patient Education  2024 [employer].

## 2024-08-13 NOTE — Progress Notes (Signed)
 Rebecca Montgomery 68 y.o.   No chief complaint on file.   HISTORY OF PRESENT ILLNESS: This is a 68 y.o. female here for follow-up of multiple chronic medical conditions including hypertension Overall doing well. Has no complaints or medical concerns today.  HPI   Prior to Admission medications   Medication Sig Start Date End Date Taking? Authorizing Provider  acyclovir (ZOVIRAX) 400 MG tablet Take 1 tablet by mouth as needed (Out breaks). 08/23/20  Yes [provider]  albuterol (VENTOLIN HFA) 108 (90 Base) MCG/ACT inhaler Inhale 1 puff into the lungs every 6 (six) hours as needed. 12/11/23  Yes [provider]  aspirin 81 MG tablet Take 81 mg by mouth daily.   Yes [provider]  docusate sodium (COLACE) 100 MG capsule Take 100 mg by mouth daily as needed for moderate constipation.   Yes [provider]  EPINEPHrine 0.3 mg/0.3 mL IJ SOAJ injection Inject 0.3 mg into the muscle as needed for anaphylaxis. 07/25/18  Yes [provider]  ezetimibe  (ZETIA ) 10 MG tablet Take 1 tablet by mouth once daily 10/30/23  Yes [name], Vinie BROCKS, MD  inclisiran (LEQVIO ) 284 MG/1.5ML SOSY injection Inject 284 mg into the skin once.   Yes [provider]  omeprazole  (PRILOSEC OTC) 20 MG tablet Take 20 mg by mouth 2 (two) times daily.   Yes [provider]  predniSONE (DELTASONE) 10 MG tablet Take 10 mg by mouth as needed. 04/29/24  Yes [provider]  senna (SENOKOT) 8.6 MG tablet Take 1 tablet by mouth daily. As needed   Yes [provider]    Allergies  Allergen Reactions   Bee Venom Other (See Comments)    Serum sickness reported with insect bite. Has been treated with prednisone only in the past per patient.  Also, fire ants.  Anaphylaxis reaction.   Benzonatate Other (See Comments)    Cannot take due due to all caine allergies   Cellulose Shortness Of Breath   Covid-19 (Mrna) Vaccine Anaphylaxis   Hibiscus Extract  Shortness Of Breath and Swelling   Lidocaine Anaphylaxis    ALL CAINES PER PATIENT.   Peppermint Oil Anaphylaxis   Shellfish Allergy Anaphylaxis and Other (See Comments)    Pt Doesn't remember   Famotidine    Atorvastatin Other (See Comments)    Dementia type symptoms, pain    Repatha  [Evolocumab ] Hives and Itching   Rosuvastatin Other (See Comments)    Dementia type symptoms, pain     Patient Active Problem List   Diagnosis Date Noted   Genetic testing 06/21/2023   Family history of colon cancer 06/12/2023   Family history of breast cancer 06/12/2023   Essential hypertension 03/09/2022   Body mass index (BMI) of 23.0 to 23.9 in adult 01/26/2022   Vitamin D deficiency 01/12/2022   Secondary hypertension 01/12/2022   Postmenopausal state 12/29/2021   Osteoporosis    Basal cell adenocarcinoma    Acid reflux    Cervical spondylolysis 10/06/2020   Dyslipidemia 12/27/2018   History of skin cancer 10/03/2018   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 BMI Assessment: Current Body mass index is 23.02 kg/m.  Patient BMI currently is in the acceptable range (>=18.5 and < 25 kg/m2) . Current barriers to healthy weight management include none.  BMI Plan:  Today, Danita and I have discussed methods to help address her current weight.   No new intervention is indica   Cellulitis of left lower extremity 01/26/2020   Last Assessment &  Plan:  Formatting of this note might be different from the original. Mild swelling and pain on examination. No drainage. 2 other small areas just on each side of the original area.  Will treat with antibiotic. She is to notify if not improving.   Cervical cancer screening 01/24/2021   Last Assessment & Plan:  Formatting of this note might be different from the original. She requested deference of gyn exam with pap today due to right hand fx. She will call to reschedule this at a later date.   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 Pertinent Data:   Current medication includes: diet modification and exercise This patient does not have an active medication from one of the medication groupers..  Last Lipid Values Cholesterol (mg/dl)  Date Value  93/83/7979 271 (H)  07/04/2018 306 (H)   Triglycerides (mg/dl)  Date Value  93/83/7979 214 (H)  08/22/2   Flu-like symptoms 02/24/2021   GERD (gastroesophageal reflux disease) 04/03/2018   Last Assessment & Plan:  Formatting of this note might be different from the original. Assessment Chronic and stable No compliance issue noted with today visit  Plan Continue current treatment regimen Reviewed risk of GERD Reviewed principals of treatment Patient medication Prilosec will be continued  Last Assessment & Plan:  Formatting of this note might be different from the original. Problem co   H/O total hysterectomy 08/09/2016   Headache    History of anaphylaxis 01/20/2015   History of skin cancer 10/03/2018   Last Assessment & Plan:  Formatting of this note might be different from the original. 2 small area of concern, on forehead and to the left of the nose on the bridge. No drainage. Red, irritated skin. 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 Formatting of this note might be different from the original. 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 Problem Complexity:  Chronic problem/illness, stable  Pertinent Data: LDL Calculated (mg/dL)  Date Value  97/73/7978 223 (H)   Cholesterol (mg/dl)  Date Value  97/73/7978 310 (H)   HDL (mg/dl)  Date Value  97/73/7978 56.5   Triglycerides (mg/dl)  Date Value  97/73/7978 151 (H)   AST (U/L)  Date Value  01/09/2020 16      Long term use of drug 05/01/2019   Last Assessment & Plan:  Formatting of this note might be different from  the original. We reviewed age, sex, and risk factor related health maintenance interventions, largely based on the current USPSTF grade A and B recommendations.these included High blood pressure screening, diet and exercise counseling and immunization review and recommendations.   Neck pain    Neuromuscular disorder (HCC)    fibromyalgia   Nonintractable headache 08/05/2018   Osteoporosis    Palpitations 12/27/2018   Paresthesia 10/06/2020   Postmenopausal state 12/29/2021   Screen for colon cancer 07/06/2018   Last Assessment & Plan:  Formatting of this note might be different from the original. FOBT cards given   Serum sickness 05/06/2020   Last Assessment & Plan:  Formatting of this note might be different from the original. She is doing very well and is followed closely with [facility]  Last Assessment & Plan:  Formatting of this note might be different from the original. Doing well and follows with [facility].   Sore throat 02/24/2021   Suspected 2019 novel coronavirus infection 02/24/2021   Tinnitus 01/12/2022   Vaccine counseling 01/26/2022   Last  Assessment & Plan:  Formatting of this note might be different from the original. Advised to get the pneumonia vaccine which is recommended by the CDC's ACIP. Advised to get the Shingrix vaccine (shingles) which is also recommended by the CDC's ACIP, which may or may not be covered by her Medicare part D plan and which we do not stock at this office given the uncertainty regarding reimburseme   Vertigo 01/12/2022   Vitamin D deficiency 01/12/2022   Vocal cord dysfunction 09/16/2013    Past Surgical History:  Procedure Laterality Date   ABDOMINAL HYSTERECTOMY     EYE SURGERY Right    HERNIA REPAIR     KNEE SURGERY Right    WRIST SURGERY Right    left wrist in 2023    Social History   Socioeconomic History   Marital status: Divorced    Spouse name: Not on file   Number of children: 2   Years of education: masters   Highest education level: Master's degree (e.g., MA, MS, MEng, MEd, MSW, MBA)  Occupational History   Occupation: Retired   Occupation: Environmental health practitioner time  Tobacco Use   Smoking status: Never   Smokeless tobacco: Never  Vaping Use   Vaping status: Never Used  Substance and Sexual Activity   Alcohol use: Never   Drug use: Never   Sexual activity: Not on file  Other Topics Concern   Not on file  Social History Narrative   Lives at home alone.   Right-handed.   Glass of tea in the morning.    Social Drivers of Corporate investment banker Strain: Low Risk  (06/01/2024)   Overall Financial Resource Strain (CARDIA)    Difficulty of Paying Living Expenses: Not hard at all  Food Insecurity: No Food Insecurity (06/01/2024)   Hunger Vital Sign    Worried About Running Out of Food in the Last Year: Never true    Ran Out of Food in the Last Year: Never true  Transportation Needs: No Transportation Needs (06/01/2024)   PRAPARE - Administrator, Civil Service (Medical): No    Lack of Transportation (Non-Medical): No  Physical Activity: Sufficiently Active  (06/01/2024)   Exercise Vital Sign    Days of Exercise per Week: 7 days    Minutes of Exercise per Session: 120 min  Stress: No Stress Concern Present (06/01/2024)   [employer] of Occupational Health - Occupational  Stress Questionnaire    Feeling of Stress: Not at all  Social Connections: Moderately Integrated (06/01/2024)   Social Connection and Isolation Panel    Frequency of Communication with Friends and Family: More than three times a week    Frequency of Social Gatherings with Friends and Family: More than three times a week    Attends Religious Services: More than 4 times per year    Active Member of [employer] or Organizations: Yes    Attends Banker Meetings: More than 4 times per year    Marital Status: Divorced  Intimate Partner Violence: Patient Unable To Answer (06/02/2024)   Humiliation, Afraid, Rape, and Kick questionnaire    Fear of Current or Ex-Partner: Patient unable to answer    Emotionally Abused: Patient unable to answer    Physically Abused: Patient unable to answer    Sexually Abused: Patient unable to answer    Family History  Problem Relation Age of Onset   Dementia Mother        heavy smoker   Heart failure Mother    Colon cancer Mother 49   Basal cell carcinoma Mother 21   Congestive Heart Failure Father    Diabetes Sister    Hyperlipidemia Sister    Heart disease Sister    Diabetes Sister    Hyperlipidemia Sister    Cervical cancer Sister    Hyperlipidemia Brother    Heart attack Brother    Stroke Brother    Seizures Brother    Basal cell carcinoma Brother    Stroke Brother    Hyperlipidemia Brother    Heart defect Daughter    Colon cancer Daughter 78       stage 3   Colon polyps Son    Colon cancer Maternal Aunt 71 - 64   Colon cancer Paternal Uncle 9 - 59   Breast cancer Cousin        6 maternal second cousins   Breast cancer Other        maternal first cousin once removed- female breast cancer   Stomach cancer Neg Hx     Esophageal cancer Neg Hx      Review of Systems  Constitutional: Negative.  Negative for chills and fever.  HENT: Negative.  Negative for congestion and sore throat.   Respiratory: Negative.  Negative for cough and shortness of breath.   Cardiovascular: Negative.  Negative for chest pain and palpitations.  Gastrointestinal:  Negative for abdominal pain, nausea and vomiting.  Genitourinary: Negative.  Negative for dysuria and hematuria.  Skin: Negative.  Negative for rash.  Neurological: Negative.  Negative for dizziness and headaches.  All other systems reviewed and are negative.   Vitals:   08/13/24 1521  BP: 118/70  Pulse: 63  Temp: 98.1 F (36.7 C)  SpO2: 99%    Physical Exam Vitals reviewed.  Constitutional:      Appearance: Normal appearance.  HENT:     Head: Normocephalic.     Right Ear: Tympanic membrane, ear canal and external ear normal.     Left Ear: Tympanic membrane, ear canal and external ear normal.     Mouth/Throat:     Mouth: Mucous membranes are moist.     Pharynx: Oropharynx is clear.  Eyes:     Extraocular Movements: Extraocular movements intact.     Pupils: Pupils are equal, round, and reactive to light.  Cardiovascular:     Rate and Rhythm: Normal rate and regular rhythm.  Pulses: Normal pulses.     Heart sounds: Normal heart sounds.  Pulmonary:     Effort: Pulmonary effort is normal.     Breath sounds: Normal breath sounds.  Abdominal:     Palpations: Abdomen is soft.     Tenderness: There is no abdominal tenderness.  Musculoskeletal:     Cervical back: No tenderness.  Lymphadenopathy:     Cervical: No cervical adenopathy.  Skin:    General: Skin is warm and dry.     Capillary Refill: Capillary refill takes less than 2 seconds.  Neurological:     General: No focal deficit present.     Mental Status: She is alert and oriented to person, place, and time.  Psychiatric:        Mood and Affect: Mood normal.        Behavior: Behavior  normal.      ASSESSMENT & PLAN: A total of 42 minutes was spent with the patient and counseling/coordination of care regarding preparing for this visit, review of most recent office visit notes, review of multiple chronic medical conditions and their management, review of all medications, review of most recent bloodwork results, review of health maintenance items, education on nutrition, prognosis, documentation, and need for follow up.   Problem List Items Addressed This Visit       Cardiovascular and Mediastinum   Essential hypertension   BP Readings from Last 3 Encounters:  08/13/24 118/70  07/23/24 132/77  01/21/24 128/71  Off medications Cardiovascular risks associated with hypertension discussed Eating well and staying physically active No concerns at present time       Relevant Orders   CBC with Differential/Platelet   Comprehensive metabolic panel with GFR   Hemoglobin A1c     Digestive   GERD (gastroesophageal reflux disease)   Stable and asymptomatic. No concerns       Relevant Orders   CBC with Differential/Platelet     Other   Dyslipidemia - Primary   Intolerant to statins Stable chronic condition Continues Leqvio  284 mg injections Continue Zetia  10 mg daily Diet and nutrition discussed        Relevant Orders   Hemoglobin A1c   Lipid panel   Vitamin D deficiency   Vitamin D level done today Recommend to take daily vitamin D supplementation      Relevant Orders   VITAMIN D 25 Hydroxy (Vit-D Deficiency, Fractures)   Patient Instructions  Health Maintenance After Age 41 After age 70, you are at a higher risk for certain long-term diseases and infections as well as injuries from falls. Falls are a major cause of broken bones and head injuries in people who are older than age 64. Getting regular preventive care can help to keep you healthy and well. Preventive care includes getting regular testing and making lifestyle changes as recommended by your  health care provider. Talk with your health care provider about: Which screenings and tests you should have. A screening is a test that checks for a disease when you have no symptoms. A diet and exercise plan that is right for you. What should I know about screenings and tests to prevent falls? Screening and testing are the best ways to find a health problem early. Early diagnosis and treatment give you the best chance of managing medical conditions that are common after age 70. Certain conditions and lifestyle choices may make you more likely to have a fall. Your health care provider may recommend: Regular vision checks. Poor vision  and conditions such as cataracts can make you more likely to have a fall. If you wear glasses, make sure to get your prescription updated if your vision changes. Medicine review. Work with your health care provider to regularly review all of the medicines you are taking, including over-the-counter medicines. Ask your health care provider about any side effects that may make you more likely to have a fall. Tell your health care provider if any medicines that you take make you feel dizzy or sleepy. Strength and balance checks. Your health care provider may recommend certain tests to check your strength and balance while standing, walking, or changing positions. Foot health exam. Foot pain and numbness, as well as not wearing proper footwear, can make you more likely to have a fall. Screenings, including: Osteoporosis screening. Osteoporosis is a condition that causes the bones to get weaker and break more easily. Blood pressure screening. Blood pressure changes and medicines to control blood pressure can make you feel dizzy. Depression screening. You may be more likely to have a fall if you have a fear of falling, feel depressed, or feel unable to do activities that you used to do. Alcohol use screening. Using too much alcohol can affect your balance and may make you more  likely to have a fall. Follow these instructions at home: Lifestyle Do not drink alcohol if: Your health care provider tells you not to drink. If you drink alcohol: Limit how much you have to: 0-1 drink a day for women. 0-2 drinks a day for men. Know how much alcohol is in your drink. In the [place] one drink equals one 12 oz bottle of beer (355 mL), one 5 oz glass of wine (148 mL), or one 1 oz glass of hard liquor (44 mL). Do not use any products that contain nicotine or tobacco. These products include cigarettes, chewing tobacco, and vaping devices, such as e-cigarettes. If you need help quitting, ask your health care provider. Activity  Follow a regular exercise program to stay fit. This will help you maintain your balance. Ask your health care provider what types of exercise are appropriate for you. If you need a cane or walker, use it as recommended by your health care provider. Wear supportive shoes that have nonskid soles. Safety  Remove any tripping hazards, such as rugs, cords, and clutter. Install safety equipment such as grab bars in bathrooms and safety rails on stairs. Keep rooms and walkways well-lit. General instructions Talk with your health care provider about your risks for falling. Tell your health care provider if: You fall. Be sure to tell your health care provider about all falls, even ones that seem minor. You feel dizzy, tiredness (fatigue), or off-balance. Take over-the-counter and prescription medicines only as told by your health care provider. These include supplements. Eat a healthy diet and maintain a healthy weight. A healthy diet includes low-fat dairy products, low-fat (lean) meats, and fiber from whole grains, beans, and lots of fruits and vegetables. Stay current with your vaccines. Schedule regular health, dental, and eye exams. Summary Having a healthy lifestyle and getting preventive care can help to protect your health and wellness after age  30. Screening and testing are the best way to find a health problem early and help you avoid having a fall. Early diagnosis and treatment give you the best chance for managing medical conditions that are more common for people who are older than age 40. Falls are a major cause of broken bones and head injuries  in people who are older than age 74. Take precautions to prevent a fall at home. Work with your health care provider to learn what changes you can make to improve your health and wellness and to prevent falls. This information is not intended to replace advice given to you by your health care provider. Make sure you discuss any questions you have with your health care provider. Document Revised: 03/21/2021 Document Reviewed: 03/21/2021 Elsevier Patient Education  2024 Elsevier Inc.     Emil Schaumann, MD [facility]

## 2024-08-13 NOTE — Assessment & Plan Note (Addendum)
 BP Readings from Last 3 Encounters:  08/13/24 118/70  07/23/24 132/77  01/21/24 128/71  Off medications Cardiovascular risks associated with hypertension discussed Eating well and staying physically active No concerns at present time

## 2024-08-13 NOTE — Assessment & Plan Note (Signed)
 Vitamin D level done today Recommend to take daily vitamin D supplementation

## 2024-08-13 NOTE — Assessment & Plan Note (Signed)
 Intolerant to statins Stable chronic condition Continues Leqvio  284 mg injections Continue Zetia  10 mg daily Diet and nutrition discussed

## 2024-08-18 ENCOUNTER — Other Ambulatory Visit

## 2024-08-18 ENCOUNTER — Ambulatory Visit: Payer: Self-pay | Admitting: Emergency Medicine

## 2024-08-18 DIAGNOSIS — K219 Gastro-esophageal reflux disease without esophagitis: Secondary | ICD-10-CM | POA: Diagnosis not present

## 2024-08-18 DIAGNOSIS — I10 Essential (primary) hypertension: Secondary | ICD-10-CM

## 2024-08-18 DIAGNOSIS — E785 Hyperlipidemia, unspecified: Secondary | ICD-10-CM | POA: Diagnosis not present

## 2024-08-18 DIAGNOSIS — E559 Vitamin D deficiency, unspecified: Secondary | ICD-10-CM

## 2024-08-18 LAB — LIPID PANEL
Cholesterol: 156 mg/dL (ref 0–200)
HDL: 68 mg/dL (ref >39.00)
LDL Cholesterol: 52 mg/dL (ref 0–99)
NonHDL: 87.75
Total CHOL/HDL Ratio: 2
Triglycerides: 177 mg/dL — ABNORMAL HIGH (ref 0.0–149.0)
VLDL: 35.4 mg/dL (ref 0.0–40.0)

## 2024-08-18 LAB — COMPREHENSIVE METABOLIC PANEL WITH GFR
ALT: 15 U/L (ref 0–35)
AST: 15 U/L (ref 0–37)
Albumin: 4.6 g/dL (ref 3.5–5.2)
Alkaline Phosphatase: 49 U/L (ref 39–117)
BUN: 10 mg/dL (ref 6–23)
CO2: 27 meq/L (ref 19–32)
Calcium: 10.1 mg/dL (ref 8.4–10.5)
Chloride: 101 meq/L (ref 96–112)
Creatinine, Ser: 0.78 mg/dL (ref 0.40–1.20)
GFR: 78.11 mL/min (ref >60.00)
Glucose, Bld: 87 mg/dL (ref 70–99)
Potassium: 4.1 meq/L (ref 3.5–5.1)
Sodium: 141 meq/L (ref 135–145)
Total Bilirubin: 0.4 mg/dL (ref 0.2–1.2)
Total Protein: 7.2 g/dL (ref 6.0–8.3)

## 2024-08-18 LAB — CBC WITH DIFFERENTIAL/PLATELET
Basophils Absolute: 0 K/uL (ref 0.0–0.1)
Basophils Relative: 0.3 % (ref 0.0–3.0)
Eosinophils Absolute: 0.1 K/uL (ref 0.0–0.7)
Eosinophils Relative: 1.8 % (ref 0.0–5.0)
HCT: 38.1 % (ref 36.0–46.0)
Hemoglobin: 12.8 g/dL (ref 12.0–15.0)
Lymphocytes Relative: 33 % (ref 12.0–46.0)
Lymphs Abs: 2.5 K/uL (ref 0.7–4.0)
MCHC: 33.5 g/dL (ref 30.0–36.0)
MCV: 92.6 fl (ref 78.0–100.0)
Monocytes Absolute: 0.5 K/uL (ref 0.1–1.0)
Monocytes Relative: 6.8 % (ref 3.0–12.0)
Neutro Abs: 4.3 K/uL (ref 1.4–7.7)
Neutrophils Relative %: 58.1 % (ref 43.0–77.0)
Platelets: 297 K/uL (ref 150.0–400.0)
RBC: 4.12 Mil/uL (ref 3.87–5.11)
RDW: 12.9 % (ref 11.5–15.5)
WBC: 7.4 K/uL (ref 4.0–10.5)

## 2024-08-18 LAB — HEMOGLOBIN A1C: Hgb A1c MFr Bld: 6.1 % (ref 4.6–6.5)

## 2024-08-18 LAB — VITAMIN D 25 HYDROXY (VIT D DEFICIENCY, FRACTURES): VITD: 20.76 ng/mL — ABNORMAL LOW (ref 30.00–100.00)

## 2024-09-12 ENCOUNTER — Telehealth: Payer: Self-pay | Admitting: Pharmacy Technician

## 2024-09-12 NOTE — Telephone Encounter (Signed)
 Auth Submission: APPROVED Site of care: Site of care: [facility] Payer: [employer] MEDICARE Medication & CPT/J Code(s) submitted: Leqvio  (Inclisiran) J1306 Diagnosis Code: E78.49 Route of submission (phone, fax, portal):  Phone # Fax # Auth type: Buy/Bill PB Units/visits requested: 284MG  Q6 MONTHS Reference number: [id number] Approval from: 11/14/23 to 11/12/25

## 2024-10-27 ENCOUNTER — Other Ambulatory Visit: Payer: Self-pay | Admitting: Internal Medicine

## 2025-01-21 ENCOUNTER — Ambulatory Visit

## 2025-02-11 ENCOUNTER — Ambulatory Visit: Admitting: Emergency Medicine

## 2025-06-03 ENCOUNTER — Ambulatory Visit
